# Patient Record
Sex: Female | Born: 1940
Health system: Southern US, Community
[De-identification: ages and names within clinical notes are randomized; demographics above are authoritative.]

## PROBLEM LIST (undated history)

## (undated) DIAGNOSIS — H269 Unspecified cataract: Secondary | ICD-10-CM

## (undated) DIAGNOSIS — S0590XA Unspecified injury of unspecified eye and orbit, initial encounter: Secondary | ICD-10-CM

## (undated) DIAGNOSIS — I10 Essential (primary) hypertension: Secondary | ICD-10-CM

## (undated) HISTORY — DX: Unspecified injury of unspecified eye and orbit, initial encounter: S05.90XA

## (undated) HISTORY — PX: OTHER SURGICAL HISTORY: SHX169

## (undated) HISTORY — PX: CHOLECYSTECTOMY: SHX55

## (undated) HISTORY — DX: Unspecified cataract: H26.9

## (undated) HISTORY — DX: Essential (primary) hypertension: I10

---

## 2006-05-04 ENCOUNTER — Ambulatory Visit (HOSPITAL_BASED_OUTPATIENT_CLINIC_OR_DEPARTMENT_OTHER): Admission: RE | Admit: 2006-05-04 | Discharge: 2006-05-04 | Payer: Self-pay | Admitting: Orthopedic Surgery

## 2007-05-04 ENCOUNTER — Encounter: Payer: Self-pay | Admitting: Cardiology

## 2008-03-13 ENCOUNTER — Encounter: Payer: Self-pay | Admitting: Cardiology

## 2008-09-04 ENCOUNTER — Encounter: Payer: Self-pay | Admitting: Cardiology

## 2008-10-15 ENCOUNTER — Encounter: Payer: Self-pay | Admitting: Cardiology

## 2009-06-17 ENCOUNTER — Encounter: Payer: Self-pay | Admitting: Cardiology

## 2010-02-17 ENCOUNTER — Encounter: Payer: Self-pay | Admitting: Cardiology

## 2010-02-24 ENCOUNTER — Ambulatory Visit: Payer: Self-pay | Admitting: Cardiology

## 2010-02-24 ENCOUNTER — Encounter: Payer: Self-pay | Admitting: Cardiology

## 2010-03-03 ENCOUNTER — Encounter (INDEPENDENT_AMBULATORY_CARE_PROVIDER_SITE_OTHER): Payer: Self-pay | Admitting: *Deleted

## 2010-03-03 ENCOUNTER — Ambulatory Visit: Payer: Self-pay | Admitting: Cardiology

## 2010-03-03 DIAGNOSIS — I10 Essential (primary) hypertension: Secondary | ICD-10-CM | POA: Insufficient documentation

## 2010-03-09 ENCOUNTER — Ambulatory Visit: Payer: Self-pay | Admitting: Cardiology

## 2010-03-09 ENCOUNTER — Encounter: Payer: Self-pay | Admitting: Cardiology

## 2010-03-11 ENCOUNTER — Telehealth (INDEPENDENT_AMBULATORY_CARE_PROVIDER_SITE_OTHER): Payer: Self-pay | Admitting: *Deleted

## 2010-03-13 ENCOUNTER — Encounter: Payer: Self-pay | Admitting: Cardiology

## 2010-03-13 ENCOUNTER — Encounter (INDEPENDENT_AMBULATORY_CARE_PROVIDER_SITE_OTHER): Payer: Self-pay | Admitting: *Deleted

## 2010-03-16 ENCOUNTER — Encounter: Payer: Self-pay | Admitting: Cardiology

## 2010-03-20 ENCOUNTER — Ambulatory Visit: Payer: Self-pay | Admitting: Cardiology

## 2010-03-20 ENCOUNTER — Inpatient Hospital Stay (HOSPITAL_BASED_OUTPATIENT_CLINIC_OR_DEPARTMENT_OTHER): Admission: RE | Admit: 2010-03-20 | Discharge: 2010-03-20 | Payer: Self-pay | Admitting: Cardiology

## 2010-03-23 ENCOUNTER — Telehealth (INDEPENDENT_AMBULATORY_CARE_PROVIDER_SITE_OTHER): Payer: Self-pay | Admitting: *Deleted

## 2010-03-26 ENCOUNTER — Ambulatory Visit: Payer: Self-pay | Admitting: Cardiology

## 2010-03-26 DIAGNOSIS — I2510 Atherosclerotic heart disease of native coronary artery without angina pectoris: Secondary | ICD-10-CM | POA: Insufficient documentation

## 2010-09-29 NOTE — Letter (Signed)
Summary: External Correspondence/ OFFICE NOTE [facility]  External Correspondence/ OFFICE NOTE [facility]   Imported By: Dorise Hiss 02/26/2010 15:06:17  _____________________________________________________________________  External Attachment:    Type:   Image     Comment:   External Document

## 2010-09-29 NOTE — Progress Notes (Signed)
Summary: Low Grade temp following cath  Phone Note Call from Patient Call back at Work Phone [phone]   Summary of Call: Pt states she has been running a low grade fever. She states yesterday her temp was 100.1 and today it was 99. She states her cath insertion site at her groin looks good with no swelling, redness, drainage or pain. She states she did have some pain when she urinated this am like she could possibly have an UTI. Pt will monitor symptoms and go to primary MD if urinary symptoms continue. She is aware to go to ER for redness, drainage, swelling or pain at cath insertion site. Pt verbalized understanding.  Initial call taken by: Cyril Loosen, RN, BSN,  March 23, 2010 10:49 AM

## 2010-09-29 NOTE — Progress Notes (Signed)
Summary: RESULTS OF STRESS TEST  Phone Note Call from Patient Call back at Home Phone [phone]   Reason for Call: Lab or Test Results Summary of Call: RESULTS OF STRESS TEST ON 03/09/2010 Initial call taken by: Dorise Hiss,  March 11, 2010 10:01 AM  Follow-up for Phone Call        Left message to notify pt that test has not been reviewed by Dr. Antoine Poche and she will be notified as soon as these results are reviewed. Follow-up by: Cyril Loosen, RN, BSN,  March 11, 2010 4:04 PM

## 2010-09-29 NOTE — Letter (Signed)
Summary: Cardiac Cath Instructions - [facility]  [facility] at [facility] [address]. [address]   [place], [place] [address]   Phone: [phone]  Fax: [phone]     03/13/2010 MRN: [id number]  Anna Flynn [address] [place], [place]  [address]  Dear Ms. Storer,   You are scheduled for a Cardiac Catheterization on Friday, March 20, 2010 with Dr. Antoine Poche.  Please arrive to the 1st floor of the Heart and Vascular Center at [facility] at 8:30 am  on the day of your procedure. Please do not arrive before 6:30 a.m. Call the Heart and Vascular Center at [phone] if you are unable to make your appointmnet. The Code to get into the parking garage under the building is 1000. Take the elevators to the 1st floor. You must have someone to drive you home. Someone must be with you for the first 24 hours after you arrive home. Please wear clothes that are easy to get on and off and wear slip-on shoes. Do not eat or drink after midnight except water with your medications that morning. Bring all your medications and current insurance cards with you.  ___ DO NOT take these medications before your procedure:  _X__ Make sure you take your aspirin.  _X__ You may take ALL of your medications with water that morning.  ___ DO NOT take ANY medications before your procedure.  ___ Pre-med instructions:  ________________________________________________________________________  The usual length of stay after your procedure is 2 to 3 hours. This can vary.  If you have any questions, please call the office at the number listed above.  Cyril Loosen, RN, BSN              Directions to the [facility]  Please Note : Park in [place] under the building not the parking deck.  From [employer]: Turn onto [address] Left onto [address] (1st stoplight) Right at the brick entrance to the hospital ([address]) Bear to the right  and you will see a blue sign "Heart and Vascular Center" Parking garage is a sharp right'to get through the gate out in the code _1000______. Once you park, take the elevator to the first floor. Please do not arrive before 0630am. The building will be dark before that time.   From [address] Turn onto [address] Turn left into the brick entrance to the hospital (Main circle [address]) [place] to the right and you will see a blue sign "[facility] V[facility] Center" Parking garage is a sharp right, to get thru the gate put in the code 1000____. Once you park, take the elevator to the first floor. Please do not arrive before 0630am. The building will be dark before that time

## 2010-09-29 NOTE — Letter (Signed)
Summary: External Correspondence/ FAXED  PRE-CATH ORDER  External Correspondence/ FAXED  PRE-CATH ORDER   Imported By: Dorise Hiss 03/24/2010 10:18:02  _____________________________________________________________________  External Attachment:    Type:   Image     Comment:   External Document

## 2010-09-29 NOTE — Assessment & Plan Note (Signed)
Summary: ckafterprocedure/hochreinpat/rm   Visit Type:  Follow-up Primary Provider:  Quintin Alto  CC:  check right cath site.  History of Present Illness: patient presents for post catheterization followup.  She was recently referred here to Dr. Antoine Poche, following an abnormal perfusion imaging study suggestive of inferior ischemia. This was in the setting of complaint of dizziness. The patient informs me today that she never experienced any chest pain.  Dr. Antoine Poche initially recommended a followup dobutamine echocardiogram, given his concern that this was probably a false positive test result. Dr. Andee Lineman reviewed the stress echo, and indicated suggestion of possible mild inferior ischemia. Given this, Dr. Antoine Poche recommended proceeding with a diagnostic coronary angiogram, for a definitive answer.  Dr. Antoine Poche performed the study himself, which yielded minimal coronary artery disease (25% ostial LAD), and normal LV function (EF 55%) with normal wall motion. Continued risk factor reduction was recommended.  Clinically, patient denies any chest pain, or complications of the right groin incision site.     Preventive Screening-Counseling & Management  Alcohol-Tobacco     Smoking Status: never  Current Medications (verified): 1)  Advil 200 Mg Tabs (Ibuprofen) .... As Needed  Allergies (verified): 1)  ! Codeine 2)  ! Relafen  Comments:  Nurse/Medical Assistant: The patient's medications and allergies were verbally reviewed with the patient and were updated in the Medication and Allergy Lists.  Past History:  Past Medical History: hypertension  Review of Systems       No fevers, chills, hemoptysis, dysphagia, melena, hematocheezia, hematuria, rash, claudication, orthopnea, pnd, pedal edema. All other systems negative.   Vital Signs:  Patient profile:   70 year old female Height:      65 inches Weight:      185 pounds Pulse rate:   101 / minute BP sitting:   162  / 114  (left arm) Cuff size:   large  Vitals Entered By: Carlye Grippe (March 26, 2010 1:03 PM) CC: check right cath site   Physical Exam  Additional Exam:  GEN :70 year old female, mildly obese, in no distress HEENT: NCAT,PERRLA,EOMI NECK: palpable pulses, no bruits; no JVD; no TM LUNGS: CTA bilaterally HEART: RRR (S1S2); no significant murmurs; no rubs; no gallops ABD: soft, NT; intact BS [id number] groin stable with palpable pulse, no ecchymosis, and no bruit; no peripheral edema SKIN: warm, dry MUSC: no obvious deformity NEURO: A/O (x3)  \par  Impression & Recommendations:  Problem # 1:  CAD (ICD-414.00)  no further workup indicated. Patient presents post catheterization, which yielded minimal CAD and normal LV function, following a false positive perfusion imaging study. Recommendation is for risk factor modification, starting with low dose aspirin for prophylaxis. Patient is concerned about the combined effects with nonsteroidals, which she uses on a near daily basis for arthritis. I instructed her to keep a close eye out for possible bruising or evidence of bleeding. I also instructed her to monitor her cholesterol levels, trying to maintain an LDL of 70 or less, if feasible. She is to followup with Dr. Leandrew Koyanagi, and will otherwise return to Dr. Antoine Poche on an as-needed basis.  Problem # 2:  ESSENTIAL HYPERTENSION, BENIGN (ICD-401.1)  patient suggests that she may have "white coat" hypertension, given that this does appear to be elevated whenever she is in an MD's office. She reportedly has systolic readings less than 140, at home. I instructed her to monitor this closely, and to followup with Dr. Leandrew Koyanagi.  Patient Instructions: 1)  Follow up as needed  2)  Your physician discussed the risks, benefits and indications for preventive aspirin therapy. It is recommended that you start (or continue) taking 81 mg of aspirin a day.

## 2010-09-29 NOTE — Assessment & Plan Note (Signed)
Summary: NP-ABNORMAL ADENOSINE STUDY   Visit Type:  Initial Consult Primary Provider:  Quintin Alto  CC:  abnormal EKG and stress test.  History of Present Illness: The H. and presents for evaluation of an abnormal stress test. She had episodes of dizziness and some chest discomfort and so was sent for a stress test. She said she was not able to walk on a treadmill because of dizziness and so this was a pharmacologic perfusion study. This was read as abnormal with a well-preserved ejection fraction of 58%. However, there was a reversible defect medium sized in the basal inferior wall. The patient is referred for evaluation of this.  She is active taking care of 2 young great-grandchildren. She does walking for exercise as well. With this she denies any chest pressure, neck or arm discomfort. She does not shortness of breath, PND or orthopnea. She does not report palpitations, presyncope or syncope. She does have occasional dizziness which she treats with a Dramamine. She has not had any frank loss of consciousness. She does have rare chest discomfort over the years with the last episode being 2 days ago. This is always while driving. It is in the upper epigastric discomfort that does not radiate and is associated with some diaphoresis. However, it always goes away with burping. She cannot bring this on with activity. She has had it in frequently for several years.  Preventive Screening-Counseling & Management  Alcohol-Tobacco     Smoking Status: never  Current Medications (verified): 1)  Advil 200 Mg Tabs (Ibuprofen) .... As Needed  Allergies (verified): 1)  ! Codeine 2)  ! Relafen  Comments:  Nurse/Medical Assistant: The patient's medications and allergies were verbally reviewed with the patient and were updated in the Medication and Allergy Lists.  Past History:  Past Medical History: None  Past Surgical History: Cholecystectomy Left thumb A1 pulley release.  Family  History: The patient has no history of early heart disease other than a brother who apparently died of a heart attack related to electroshock therapy for depression. He was in his early 55s.  Social History: Smoking Status:  never  Review of Systems       As stated in the HPI and negative for all other systems.   Vital Signs:  Patient profile:   70 year old female Height:      65 inches Weight:      185 pounds BMI:     30.90 Pulse rate:   87 / minute Pulse (ortho):   90 / minute BP sitting:   157 / 90  (left arm) BP standing:   147 / 82 Cuff size:   large  Vitals Entered By: Carlye Grippe (March 03, 2010 11:06 AM)  Nutrition Counseling: Patient's BMI is greater than 25 and therefore counseled on weight management options.  Serial Vital Signs/Assessments:  Time      Position  BP       Pulse  Resp  Temp     By 11:22 AM  Lying LA  128/79   86                    Lydia Anderson 11:22 AM  Sitting   135/75   93                    Lydia Anderson 11:22 AM  Standing  147/82   90  Carlye Grippe 11:24 AM  Standing  142/89   92                    Lydia Anderson 11:29 AM  Standing  151/89   97                    Carlye Grippe  CC: abnormal EKG,stress test   Physical Exam  General:  Well developed, well nourished, in no acute distress. Head:  normocephalic and atraumatic Eyes:  PERRLA/EOM intact; conjunctiva and lids normal. Mouth:  Teeth, gums and palate normal. Oral mucosa normal. Neck:  Neck supple, no JVD. No masses, thyromegaly or abnormal cervical nodes. Chest Wall:  no deformities or breast masses noted Lungs:  Clear bilaterally to auscultation and percussion. Abdomen:  Bowel sounds positive; abdomen soft and non-tender without masses, organomegaly, or hernias noted. No hepatosplenomegaly. Msk:  Back normal, normal gait. Muscle strength and tone normal. Extremities:  No clubbing or cyanosis. Neurologic:  Alert and oriented x 3. Skin:  Intact without  lesions or rashes. Cervical Nodes:  no significant adenopathy Axillary Nodes:  no significant adenopathy Inguinal Nodes:  no significant adenopathy Psych:  Normal affect.   Detailed Cardiovascular Exam  Neck    Carotids: Carotids full and equal bilaterally without bruits.      Neck Veins: Normal, no JVD.    Heart    Inspection: no deformities or lifts noted.      Palpation: normal PMI with no thrills palpable.      Auscultation: regular rate and rhythm, S1, S2 without murmurs, rubs, gallops, or clicks.    Vascular    Abdominal Aorta: no palpable masses, pulsations, or audible bruits.      Femoral Pulses: normal femoral pulses bilaterally.      Pedal Pulses: normal pedal pulses bilaterally.      Radial Pulses: normal radial pulses bilaterally.      Peripheral Circulation: no clubbing, cyanosis, or edema noted with normal capillary refill.     Impression & Recommendations:  Problem # 1:  OTH NONSPECIFIC ABNORM CV SYSTEM FUNCTION STUDY (ICD-794.39) The patient had an abnormal stress perfusion study. However, in the absence of symptoms, significant risk factors, abnormal physical findings or diagnostic EKG I think that the possibility of a false positive test is very high. I discussed this at length with the patient and her husband. I do not think invasive evaluation is warranted at this point but would rather pursue a dobutamine echocardiogram. Because of previous dizziness she doesn't think she would be able to walk on a treadmill. If this is negative for wall motion abnormalities no further workup would be suggested. Orders: EKG w/ Interpretation (93000) Echo- Dobutamine (Dobutamine Echo)  Problem # 2:  SYNCOPE AND COLLAPSE (ICD-780.2) She had negative orthostatic blood pressures. I would not suggest a cardiac etiology. No further workup is suggested. Orders: EKG w/ Interpretation (93000) Echo- Dobutamine (Dobutamine Echo)  Problem # 3:  ESSENTIAL HYPERTENSION, BENIGN  (ICD-401.1) Her blood pressure is slightly elevated today. However, she reports that this is unusual. She can keep a blood pressure diary with further treatments based on these readings.  Patient Instructions: 1)  Your physician has requested that you have a dobutamine echocardiogram.  For further information please visit [email or web address]  Please follow instruction sheet as given. 2)  Your physician recommends that you continue on your current medications as directed. Please refer to the Current Medication list given to you today.

## 2010-09-29 NOTE — Letter (Signed)
Summary: Dobutamine Echocardiogram  [facility] at [facility] [address]. [address]   [place], [place] [address]   Phone: [phone]  Fax: [phone]      [facility]  Dobutamine Echocardiogram    Lateria Howington  Appointment Date:_  Appointment Time:_   Your doctor has ordered a Dobutamine echo to help determine the condition of our heart. If you take blood pressure medication, ask your doctor if you should take your medications the day of the procedure. Do not eat and drink 4 hours before the test is scheduled.  You will be asked to undress from the waist up and given a hospital gown to wear, so dress comfortaly from the waist down.  You will need to register at the Outpatient Department at [facility], located at the front enttrance of the hospital. Make sure to bring your insurance information and a form of ID.  Your appointment will last approximately one and one-half hours

## 2011-01-15 NOTE — Op Note (Signed)
NAME:  Anna Flynn, Anna Flynn               ACCOUNT NO.:  [id number]   MEDICAL RECORD NO.:  [id number]          PATIENT TYPE:  AMB   LOCATION:  DSC                          FACILITY:  [facility]   PHYSICIAN:  Artist Pais. Weingold, M.D.DATE OF BIRTH:  10-09-1940   DATE OF PROCEDURE:  DATE OF DISCHARGE:  05/04/2006                                 OPERATIVE REPORT   REDICTATION:   DATE OF SERVICE:  05/04/2006   PREOPERATIVE DIAGNOSIS:  Left thumb chronic stenosing tenosynovitis.   POSTOPERATIVE DIAGNOSIS:  Left thumb chronic stenosing tenosynovitis.   PROCEDURE:  Left thumb __________ .   SURGEON:  Artist Pais. Mina Marble, M.D.   ASSISTANT:  None.   ANESTHESIA:  Monitored anesthesia care.  __________ anesthesia of 2% plain  lidocaine, 0.25% Marcaine, 3 mL performed by the surgeon.   TOURNIQUET TIME:  10 minutes.   COMPLICATIONS:  None.   DRAINS:  None.   OPERATIVE REPORT:  The patient was taken to the operating room with the  induction of adequate IV sedation. The left upper extremity is prepped and  draped in sterile fashion.  An Esmarch was used to exsanguinate the limb.  The tourniquet was then inflated to 250 mmHg.  At this point in time 3 mL  combination of 0.25% Marcaine, 2% lidocaine was injected into A1 pulley area  of the left thumb.  The skin was incised, neurovascular bundle was  identified and retracted.  The A1 pulley was scored with a #15 blade.  The  A1 pulley was lysed of all adhesions.  It was irrigated and easily closed  with 4-0 nylon x3 sutures. Xeroform, 4x4s and compressive wraps.  The  patient tolerated the procedure well __________      Artist Pais. Mina Marble, M.D.  Electronically Signed     MAW/MEDQ  D:  05/25/2006  T:  05/25/2006  Job:  [id number]

## 2011-01-15 NOTE — Op Note (Signed)
NAME:  Anna Flynn, Anna Flynn               ACCOUNT NO.:  [id number]   MEDICAL RECORD NO.:  [id number]          PATIENT TYPE:  AMB   LOCATION:  DSC                          FACILITY:  [facility]   PHYSICIAN:  Artist Pais. Weingold, M.D.DATE OF BIRTH:  17-Mar-1941   DATE OF PROCEDURE:  05/04/2006  DATE OF DISCHARGE:                                 OPERATIVE REPORT   PREOPERATIVE DIAGNOSIS:  Chronic left thumb stenosing tenosynovitis.   POSTOPERATIVE DIAGNOSIS:  Chronic left thumb stenosing tenosynovitis.   PROCEDURE:  Left thumb A1 pulley release.   SURGEON:  Artist Pais. Mina Marble, MD   ASSISTANT:  None.   ANESTHESIA:  IV sedation with a combination of 2% plain lidocaine, 0.25%  Marcaine 3 mL field block by the surgeon.   TOURNIQUET TIME:  10 minutes.   COMPLICATIONS:  None.   DRAINS:  None.   OPERATIVE REPORT:  The patient was taken to the operating room.  After  induction of adequate IV sedation, left upper extremity was prepped in the  usual sterile fashion.  Esmarch was used to exsanguinate the limb.  Tourniquet was then inflated to 250 mmHg.  At this point in time, 3 mL  combination of 0.25% plain Marcaine and 2% plain lidocaine was injected in  the A1 pulley area of left thumb.  Incision was made transversely in the MP  flexion crease.  Skin was incised.  Neurovascular bundles identified and  retracted.  A1 pulley was split with a 15-blade.  FPL tendon was lysed of  all adhesions.  Wound was irrigated and easily closed with 5-0 nylon.  Steri-  Strips, 4 x 4 Fluffs, and compressive dressing was applied.  The patient  tolerated the procedure well.  Went to recovery room in stable fashion.      Artist Pais Mina Marble, M.D.  Electronically Signed     MAW/MEDQ  D:  05/04/2006  T:  05/04/2006  Job:  [id number]

## 2011-09-10 DIAGNOSIS — E559 Vitamin D deficiency, unspecified: Secondary | ICD-10-CM | POA: Diagnosis not present

## 2011-09-10 DIAGNOSIS — R5383 Other fatigue: Secondary | ICD-10-CM | POA: Diagnosis not present

## 2011-09-10 DIAGNOSIS — E785 Hyperlipidemia, unspecified: Secondary | ICD-10-CM | POA: Diagnosis not present

## 2011-09-10 DIAGNOSIS — E538 Deficiency of other specified B group vitamins: Secondary | ICD-10-CM | POA: Diagnosis not present

## 2011-09-17 DIAGNOSIS — C4491 Basal cell carcinoma of skin, unspecified: Secondary | ICD-10-CM | POA: Diagnosis not present

## 2011-09-17 DIAGNOSIS — E559 Vitamin D deficiency, unspecified: Secondary | ICD-10-CM | POA: Diagnosis not present

## 2011-09-17 DIAGNOSIS — J309 Allergic rhinitis, unspecified: Secondary | ICD-10-CM | POA: Diagnosis not present

## 2011-09-17 DIAGNOSIS — R51 Headache: Secondary | ICD-10-CM | POA: Diagnosis not present

## 2011-09-17 DIAGNOSIS — E538 Deficiency of other specified B group vitamins: Secondary | ICD-10-CM | POA: Diagnosis not present

## 2011-09-17 DIAGNOSIS — R5383 Other fatigue: Secondary | ICD-10-CM | POA: Diagnosis not present

## 2011-11-08 DIAGNOSIS — J019 Acute sinusitis, unspecified: Secondary | ICD-10-CM | POA: Diagnosis not present

## 2011-11-09 DIAGNOSIS — D2330 Other benign neoplasm of skin of unspecified part of face: Secondary | ICD-10-CM | POA: Diagnosis not present

## 2011-11-09 DIAGNOSIS — L570 Actinic keratosis: Secondary | ICD-10-CM | POA: Diagnosis not present

## 2011-11-09 DIAGNOSIS — D485 Neoplasm of uncertain behavior of skin: Secondary | ICD-10-CM | POA: Diagnosis not present

## 2011-12-21 DIAGNOSIS — J309 Allergic rhinitis, unspecified: Secondary | ICD-10-CM | POA: Diagnosis not present

## 2011-12-21 DIAGNOSIS — L259 Unspecified contact dermatitis, unspecified cause: Secondary | ICD-10-CM | POA: Diagnosis not present

## 2011-12-23 DIAGNOSIS — Z803 Family history of malignant neoplasm of breast: Secondary | ICD-10-CM | POA: Diagnosis not present

## 2011-12-23 DIAGNOSIS — Z1231 Encounter for screening mammogram for malignant neoplasm of breast: Secondary | ICD-10-CM | POA: Diagnosis not present

## 2012-02-23 ENCOUNTER — Encounter: Payer: Self-pay | Admitting: Cardiology

## 2012-03-10 DIAGNOSIS — E559 Vitamin D deficiency, unspecified: Secondary | ICD-10-CM | POA: Diagnosis not present

## 2012-03-10 DIAGNOSIS — R51 Headache: Secondary | ICD-10-CM | POA: Diagnosis not present

## 2012-03-10 DIAGNOSIS — R5383 Other fatigue: Secondary | ICD-10-CM | POA: Diagnosis not present

## 2012-03-10 DIAGNOSIS — E7800 Pure hypercholesterolemia, unspecified: Secondary | ICD-10-CM | POA: Diagnosis not present

## 2012-03-10 DIAGNOSIS — E538 Deficiency of other specified B group vitamins: Secondary | ICD-10-CM | POA: Diagnosis not present

## 2012-03-17 DIAGNOSIS — R5381 Other malaise: Secondary | ICD-10-CM | POA: Diagnosis not present

## 2012-03-17 DIAGNOSIS — R51 Headache: Secondary | ICD-10-CM | POA: Diagnosis not present

## 2012-03-17 DIAGNOSIS — R5383 Other fatigue: Secondary | ICD-10-CM | POA: Diagnosis not present

## 2012-03-17 DIAGNOSIS — J309 Allergic rhinitis, unspecified: Secondary | ICD-10-CM | POA: Diagnosis not present

## 2012-03-17 DIAGNOSIS — G2581 Restless legs syndrome: Secondary | ICD-10-CM | POA: Diagnosis not present

## 2012-03-17 DIAGNOSIS — E538 Deficiency of other specified B group vitamins: Secondary | ICD-10-CM | POA: Diagnosis not present

## 2012-03-17 DIAGNOSIS — C4491 Basal cell carcinoma of skin, unspecified: Secondary | ICD-10-CM | POA: Diagnosis not present

## 2012-03-17 DIAGNOSIS — E559 Vitamin D deficiency, unspecified: Secondary | ICD-10-CM | POA: Diagnosis not present

## 2012-06-06 DIAGNOSIS — J019 Acute sinusitis, unspecified: Secondary | ICD-10-CM | POA: Diagnosis not present

## 2012-06-06 DIAGNOSIS — J309 Allergic rhinitis, unspecified: Secondary | ICD-10-CM | POA: Diagnosis not present

## 2012-06-06 DIAGNOSIS — J029 Acute pharyngitis, unspecified: Secondary | ICD-10-CM | POA: Diagnosis not present

## 2012-07-17 DIAGNOSIS — J019 Acute sinusitis, unspecified: Secondary | ICD-10-CM | POA: Diagnosis not present

## 2012-10-30 DIAGNOSIS — M2010 Hallux valgus (acquired), unspecified foot: Secondary | ICD-10-CM | POA: Diagnosis not present

## 2012-10-30 DIAGNOSIS — M2040 Other hammer toe(s) (acquired), unspecified foot: Secondary | ICD-10-CM | POA: Diagnosis not present

## 2012-10-30 DIAGNOSIS — M2140 Flat foot [pes planus] (acquired), unspecified foot: Secondary | ICD-10-CM | POA: Diagnosis not present

## 2012-12-05 DIAGNOSIS — M25579 Pain in unspecified ankle and joints of unspecified foot: Secondary | ICD-10-CM | POA: Diagnosis not present

## 2012-12-05 DIAGNOSIS — M79609 Pain in unspecified limb: Secondary | ICD-10-CM | POA: Diagnosis not present

## 2012-12-12 DIAGNOSIS — C44319 Basal cell carcinoma of skin of other parts of face: Secondary | ICD-10-CM | POA: Diagnosis not present

## 2012-12-12 DIAGNOSIS — D485 Neoplasm of uncertain behavior of skin: Secondary | ICD-10-CM | POA: Diagnosis not present

## 2012-12-12 DIAGNOSIS — L408 Other psoriasis: Secondary | ICD-10-CM | POA: Diagnosis not present

## 2012-12-12 DIAGNOSIS — L851 Acquired keratosis [keratoderma] palmaris et plantaris: Secondary | ICD-10-CM | POA: Diagnosis not present

## 2012-12-18 DIAGNOSIS — N814 Uterovaginal prolapse, unspecified: Secondary | ICD-10-CM | POA: Diagnosis not present

## 2012-12-18 DIAGNOSIS — Z01419 Encounter for gynecological examination (general) (routine) without abnormal findings: Secondary | ICD-10-CM | POA: Diagnosis not present

## 2012-12-18 DIAGNOSIS — I10 Essential (primary) hypertension: Secondary | ICD-10-CM | POA: Diagnosis not present

## 2012-12-18 DIAGNOSIS — N8111 Cystocele, midline: Secondary | ICD-10-CM | POA: Diagnosis not present

## 2012-12-18 DIAGNOSIS — N8112 Cystocele, lateral: Secondary | ICD-10-CM | POA: Diagnosis not present

## 2012-12-18 DIAGNOSIS — M1710 Unilateral primary osteoarthritis, unspecified knee: Secondary | ICD-10-CM | POA: Diagnosis not present

## 2012-12-27 DIAGNOSIS — M79609 Pain in unspecified limb: Secondary | ICD-10-CM | POA: Diagnosis not present

## 2012-12-27 DIAGNOSIS — M25579 Pain in unspecified ankle and joints of unspecified foot: Secondary | ICD-10-CM | POA: Diagnosis not present

## 2013-01-11 DIAGNOSIS — Z803 Family history of malignant neoplasm of breast: Secondary | ICD-10-CM | POA: Diagnosis not present

## 2013-01-11 DIAGNOSIS — Z1231 Encounter for screening mammogram for malignant neoplasm of breast: Secondary | ICD-10-CM | POA: Diagnosis not present

## 2013-01-11 DIAGNOSIS — C44319 Basal cell carcinoma of skin of other parts of face: Secondary | ICD-10-CM | POA: Diagnosis not present

## 2013-06-15 DIAGNOSIS — Z23 Encounter for immunization: Secondary | ICD-10-CM | POA: Diagnosis not present

## 2013-06-15 DIAGNOSIS — M79609 Pain in unspecified limb: Secondary | ICD-10-CM | POA: Diagnosis not present

## 2013-06-26 DIAGNOSIS — M79609 Pain in unspecified limb: Secondary | ICD-10-CM | POA: Diagnosis not present

## 2013-06-26 DIAGNOSIS — L600 Ingrowing nail: Secondary | ICD-10-CM | POA: Diagnosis not present

## 2013-09-04 DIAGNOSIS — B029 Zoster without complications: Secondary | ICD-10-CM | POA: Diagnosis not present

## 2013-09-06 DIAGNOSIS — R5383 Other fatigue: Secondary | ICD-10-CM | POA: Diagnosis not present

## 2013-09-06 DIAGNOSIS — R5381 Other malaise: Secondary | ICD-10-CM | POA: Diagnosis not present

## 2013-09-06 DIAGNOSIS — G2581 Restless legs syndrome: Secondary | ICD-10-CM | POA: Diagnosis not present

## 2013-09-06 DIAGNOSIS — E785 Hyperlipidemia, unspecified: Secondary | ICD-10-CM | POA: Diagnosis not present

## 2013-09-06 DIAGNOSIS — E559 Vitamin D deficiency, unspecified: Secondary | ICD-10-CM | POA: Diagnosis not present

## 2013-09-06 DIAGNOSIS — E538 Deficiency of other specified B group vitamins: Secondary | ICD-10-CM | POA: Diagnosis not present

## 2013-09-12 DIAGNOSIS — C4491 Basal cell carcinoma of skin, unspecified: Secondary | ICD-10-CM | POA: Diagnosis not present

## 2013-09-12 DIAGNOSIS — E559 Vitamin D deficiency, unspecified: Secondary | ICD-10-CM | POA: Diagnosis not present

## 2013-09-12 DIAGNOSIS — G2581 Restless legs syndrome: Secondary | ICD-10-CM | POA: Diagnosis not present

## 2013-09-12 DIAGNOSIS — J309 Allergic rhinitis, unspecified: Secondary | ICD-10-CM | POA: Diagnosis not present

## 2013-09-12 DIAGNOSIS — B029 Zoster without complications: Secondary | ICD-10-CM | POA: Diagnosis not present

## 2013-09-29 DIAGNOSIS — J019 Acute sinusitis, unspecified: Secondary | ICD-10-CM | POA: Diagnosis not present

## 2013-09-29 DIAGNOSIS — J45901 Unspecified asthma with (acute) exacerbation: Secondary | ICD-10-CM | POA: Diagnosis not present

## 2014-03-22 DIAGNOSIS — Z1231 Encounter for screening mammogram for malignant neoplasm of breast: Secondary | ICD-10-CM | POA: Diagnosis not present

## 2014-03-22 DIAGNOSIS — Z803 Family history of malignant neoplasm of breast: Secondary | ICD-10-CM | POA: Diagnosis not present

## 2014-05-16 DIAGNOSIS — N949 Unspecified condition associated with female genital organs and menstrual cycle: Secondary | ICD-10-CM | POA: Diagnosis not present

## 2015-01-28 DIAGNOSIS — D519 Vitamin B12 deficiency anemia, unspecified: Secondary | ICD-10-CM | POA: Diagnosis not present

## 2015-01-28 DIAGNOSIS — R5382 Chronic fatigue, unspecified: Secondary | ICD-10-CM | POA: Diagnosis not present

## 2015-01-28 DIAGNOSIS — E782 Mixed hyperlipidemia: Secondary | ICD-10-CM | POA: Diagnosis not present

## 2015-01-28 DIAGNOSIS — E559 Vitamin D deficiency, unspecified: Secondary | ICD-10-CM | POA: Diagnosis not present

## 2015-02-03 DIAGNOSIS — M2012 Hallux valgus (acquired), left foot: Secondary | ICD-10-CM | POA: Diagnosis not present

## 2015-02-03 DIAGNOSIS — E559 Vitamin D deficiency, unspecified: Secondary | ICD-10-CM | POA: Diagnosis not present

## 2015-02-03 DIAGNOSIS — Z23 Encounter for immunization: Secondary | ICD-10-CM | POA: Diagnosis not present

## 2015-02-03 DIAGNOSIS — M79671 Pain in right foot: Secondary | ICD-10-CM | POA: Diagnosis not present

## 2015-02-03 DIAGNOSIS — M2041 Other hammer toe(s) (acquired), right foot: Secondary | ICD-10-CM | POA: Diagnosis not present

## 2015-02-03 DIAGNOSIS — Z1389 Encounter for screening for other disorder: Secondary | ICD-10-CM | POA: Diagnosis not present

## 2015-02-03 DIAGNOSIS — D519 Vitamin B12 deficiency anemia, unspecified: Secondary | ICD-10-CM | POA: Diagnosis not present

## 2015-02-03 DIAGNOSIS — M2011 Hallux valgus (acquired), right foot: Secondary | ICD-10-CM | POA: Diagnosis not present

## 2015-02-10 DIAGNOSIS — Z85828 Personal history of other malignant neoplasm of skin: Secondary | ICD-10-CM | POA: Diagnosis not present

## 2015-02-10 DIAGNOSIS — D239 Other benign neoplasm of skin, unspecified: Secondary | ICD-10-CM | POA: Diagnosis not present

## 2015-03-26 DIAGNOSIS — Z1231 Encounter for screening mammogram for malignant neoplasm of breast: Secondary | ICD-10-CM | POA: Diagnosis not present

## 2015-03-26 DIAGNOSIS — Z803 Family history of malignant neoplasm of breast: Secondary | ICD-10-CM | POA: Diagnosis not present

## 2015-05-02 DIAGNOSIS — M7062 Trochanteric bursitis, left hip: Secondary | ICD-10-CM | POA: Diagnosis not present

## 2015-05-19 DIAGNOSIS — L247 Irritant contact dermatitis due to plants, except food: Secondary | ICD-10-CM | POA: Diagnosis not present

## 2015-05-28 DIAGNOSIS — R21 Rash and other nonspecific skin eruption: Secondary | ICD-10-CM | POA: Diagnosis not present

## 2015-07-21 DIAGNOSIS — J209 Acute bronchitis, unspecified: Secondary | ICD-10-CM | POA: Diagnosis not present

## 2015-07-21 DIAGNOSIS — J019 Acute sinusitis, unspecified: Secondary | ICD-10-CM | POA: Diagnosis not present

## 2015-07-28 DIAGNOSIS — R5382 Chronic fatigue, unspecified: Secondary | ICD-10-CM | POA: Diagnosis not present

## 2015-07-28 DIAGNOSIS — E782 Mixed hyperlipidemia: Secondary | ICD-10-CM | POA: Diagnosis not present

## 2015-07-28 DIAGNOSIS — E559 Vitamin D deficiency, unspecified: Secondary | ICD-10-CM | POA: Diagnosis not present

## 2015-08-04 DIAGNOSIS — G2581 Restless legs syndrome: Secondary | ICD-10-CM | POA: Diagnosis not present

## 2015-08-04 DIAGNOSIS — E559 Vitamin D deficiency, unspecified: Secondary | ICD-10-CM | POA: Diagnosis not present

## 2015-08-04 DIAGNOSIS — C44311 Basal cell carcinoma of skin of nose: Secondary | ICD-10-CM | POA: Diagnosis not present

## 2015-08-04 DIAGNOSIS — D519 Vitamin B12 deficiency anemia, unspecified: Secondary | ICD-10-CM | POA: Diagnosis not present

## 2015-08-20 DIAGNOSIS — M7061 Trochanteric bursitis, right hip: Secondary | ICD-10-CM | POA: Diagnosis not present

## 2015-10-15 DIAGNOSIS — M7061 Trochanteric bursitis, right hip: Secondary | ICD-10-CM | POA: Diagnosis not present

## 2015-10-15 DIAGNOSIS — M7631 Iliotibial band syndrome, right leg: Secondary | ICD-10-CM | POA: Diagnosis not present

## 2015-10-15 DIAGNOSIS — M2140 Flat foot [pes planus] (acquired), unspecified foot: Secondary | ICD-10-CM | POA: Diagnosis not present

## 2015-10-22 DIAGNOSIS — M25551 Pain in right hip: Secondary | ICD-10-CM | POA: Diagnosis not present

## 2015-10-22 DIAGNOSIS — M6281 Muscle weakness (generalized): Secondary | ICD-10-CM | POA: Diagnosis not present

## 2015-10-22 DIAGNOSIS — M7631 Iliotibial band syndrome, right leg: Secondary | ICD-10-CM | POA: Diagnosis not present

## 2015-10-24 DIAGNOSIS — M6281 Muscle weakness (generalized): Secondary | ICD-10-CM | POA: Diagnosis not present

## 2015-10-24 DIAGNOSIS — M7631 Iliotibial band syndrome, right leg: Secondary | ICD-10-CM | POA: Diagnosis not present

## 2015-10-24 DIAGNOSIS — M25551 Pain in right hip: Secondary | ICD-10-CM | POA: Diagnosis not present

## 2015-10-27 DIAGNOSIS — M25551 Pain in right hip: Secondary | ICD-10-CM | POA: Diagnosis not present

## 2015-10-27 DIAGNOSIS — M6281 Muscle weakness (generalized): Secondary | ICD-10-CM | POA: Diagnosis not present

## 2015-10-27 DIAGNOSIS — M7631 Iliotibial band syndrome, right leg: Secondary | ICD-10-CM | POA: Diagnosis not present

## 2015-10-31 DIAGNOSIS — M7631 Iliotibial band syndrome, right leg: Secondary | ICD-10-CM | POA: Diagnosis not present

## 2015-10-31 DIAGNOSIS — M25551 Pain in right hip: Secondary | ICD-10-CM | POA: Diagnosis not present

## 2015-10-31 DIAGNOSIS — M6281 Muscle weakness (generalized): Secondary | ICD-10-CM | POA: Diagnosis not present

## 2015-11-03 DIAGNOSIS — M25551 Pain in right hip: Secondary | ICD-10-CM | POA: Diagnosis not present

## 2015-11-03 DIAGNOSIS — M6281 Muscle weakness (generalized): Secondary | ICD-10-CM | POA: Diagnosis not present

## 2015-11-03 DIAGNOSIS — M7631 Iliotibial band syndrome, right leg: Secondary | ICD-10-CM | POA: Diagnosis not present

## 2015-11-05 DIAGNOSIS — M7631 Iliotibial band syndrome, right leg: Secondary | ICD-10-CM | POA: Diagnosis not present

## 2015-11-05 DIAGNOSIS — M6281 Muscle weakness (generalized): Secondary | ICD-10-CM | POA: Diagnosis not present

## 2015-11-05 DIAGNOSIS — M25551 Pain in right hip: Secondary | ICD-10-CM | POA: Diagnosis not present

## 2016-07-30 DIAGNOSIS — D519 Vitamin B12 deficiency anemia, unspecified: Secondary | ICD-10-CM | POA: Diagnosis not present

## 2016-07-30 DIAGNOSIS — Z8349 Family history of other endocrine, nutritional and metabolic diseases: Secondary | ICD-10-CM | POA: Diagnosis not present

## 2016-07-30 DIAGNOSIS — E559 Vitamin D deficiency, unspecified: Secondary | ICD-10-CM | POA: Diagnosis not present

## 2016-07-30 DIAGNOSIS — G2581 Restless legs syndrome: Secondary | ICD-10-CM | POA: Diagnosis not present

## 2016-07-30 DIAGNOSIS — Z1231 Encounter for screening mammogram for malignant neoplasm of breast: Secondary | ICD-10-CM | POA: Diagnosis not present

## 2016-07-30 DIAGNOSIS — E7800 Pure hypercholesterolemia, unspecified: Secondary | ICD-10-CM | POA: Diagnosis not present

## 2016-07-30 DIAGNOSIS — R5382 Chronic fatigue, unspecified: Secondary | ICD-10-CM | POA: Diagnosis not present

## 2016-08-12 DIAGNOSIS — E559 Vitamin D deficiency, unspecified: Secondary | ICD-10-CM | POA: Diagnosis not present

## 2016-08-12 DIAGNOSIS — Z6830 Body mass index (BMI) 30.0-30.9, adult: Secondary | ICD-10-CM | POA: Diagnosis not present

## 2016-08-12 DIAGNOSIS — M79671 Pain in right foot: Secondary | ICD-10-CM | POA: Diagnosis not present

## 2016-08-12 DIAGNOSIS — G2581 Restless legs syndrome: Secondary | ICD-10-CM | POA: Diagnosis not present

## 2016-08-12 DIAGNOSIS — Z0001 Encounter for general adult medical examination with abnormal findings: Secondary | ICD-10-CM | POA: Diagnosis not present

## 2016-08-12 DIAGNOSIS — D519 Vitamin B12 deficiency anemia, unspecified: Secondary | ICD-10-CM | POA: Diagnosis not present

## 2016-09-30 DIAGNOSIS — Z23 Encounter for immunization: Secondary | ICD-10-CM | POA: Diagnosis not present

## 2016-10-18 DIAGNOSIS — E860 Dehydration: Secondary | ICD-10-CM | POA: Diagnosis not present

## 2016-10-18 DIAGNOSIS — A084 Viral intestinal infection, unspecified: Secondary | ICD-10-CM | POA: Diagnosis not present

## 2017-07-27 DIAGNOSIS — H10013 Acute follicular conjunctivitis, bilateral: Secondary | ICD-10-CM | POA: Diagnosis not present

## 2017-07-27 DIAGNOSIS — H40033 Anatomical narrow angle, bilateral: Secondary | ICD-10-CM | POA: Diagnosis not present
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
## 2017-08-01 ENCOUNTER — Encounter (INDEPENDENT_AMBULATORY_CARE_PROVIDER_SITE_OTHER): Payer: Self-pay | Admitting: Ophthalmology

## 2017-08-01 ENCOUNTER — Ambulatory Visit (INDEPENDENT_AMBULATORY_CARE_PROVIDER_SITE_OTHER): Payer: Medicare Other | Admitting: Ophthalmology

## 2017-08-01 DIAGNOSIS — S058X2D Other injuries of left eye and orbit, subsequent encounter: Secondary | ICD-10-CM

## 2017-08-01 DIAGNOSIS — H268 Other specified cataract: Secondary | ICD-10-CM | POA: Diagnosis not present

## 2017-08-01 DIAGNOSIS — H3581 Retinal edema: Secondary | ICD-10-CM | POA: Diagnosis not present

## 2017-08-01 DIAGNOSIS — H0520 Unspecified exophthalmos: Secondary | ICD-10-CM | POA: Diagnosis not present

## 2017-08-01 DIAGNOSIS — H27112 Subluxation of lens, left eye: Secondary | ICD-10-CM

## 2017-08-01 DIAGNOSIS — H21562 Pupillary abnormality, left eye: Secondary | ICD-10-CM | POA: Diagnosis not present

## 2017-08-02 ENCOUNTER — Encounter (INDEPENDENT_AMBULATORY_CARE_PROVIDER_SITE_OTHER): Payer: Self-pay | Admitting: Ophthalmology

## 2017-08-03 ENCOUNTER — Encounter (INDEPENDENT_AMBULATORY_CARE_PROVIDER_SITE_OTHER): Payer: Self-pay | Admitting: Ophthalmology

## 2017-08-09 ENCOUNTER — Other Ambulatory Visit (HOSPITAL_COMMUNITY): Payer: Self-pay

## 2017-08-09 ENCOUNTER — Ambulatory Visit (HOSPITAL_COMMUNITY): Payer: Medicare Other

## 2017-08-12 DIAGNOSIS — R5382 Chronic fatigue, unspecified: Secondary | ICD-10-CM | POA: Diagnosis not present

## 2017-08-12 DIAGNOSIS — G2581 Restless legs syndrome: Secondary | ICD-10-CM | POA: Diagnosis not present

## 2017-08-12 DIAGNOSIS — D519 Vitamin B12 deficiency anemia, unspecified: Secondary | ICD-10-CM | POA: Diagnosis not present

## 2017-08-12 DIAGNOSIS — E559 Vitamin D deficiency, unspecified: Secondary | ICD-10-CM | POA: Diagnosis not present

## 2017-08-15 ENCOUNTER — Ambulatory Visit (HOSPITAL_COMMUNITY): Admission: RE | Admit: 2017-08-15 | Payer: Medicare Other | Source: Ambulatory Visit

## 2017-08-16 DIAGNOSIS — Z23 Encounter for immunization: Secondary | ICD-10-CM | POA: Diagnosis not present

## 2017-08-16 DIAGNOSIS — Z0001 Encounter for general adult medical examination with abnormal findings: Secondary | ICD-10-CM | POA: Diagnosis not present

## 2017-08-16 DIAGNOSIS — M79671 Pain in right foot: Secondary | ICD-10-CM | POA: Diagnosis not present

## 2017-08-19 ENCOUNTER — Other Ambulatory Visit (INDEPENDENT_AMBULATORY_CARE_PROVIDER_SITE_OTHER): Payer: Self-pay | Admitting: Ophthalmology

## 2017-08-19 ENCOUNTER — Ambulatory Visit (HOSPITAL_COMMUNITY)
Admission: RE | Admit: 2017-08-19 | Discharge: 2017-08-19 | Disposition: A | Discharge status: Home / Self Care | Payer: Medicare Other | Source: Ambulatory Visit | Attending: Ophthalmology | Admitting: Ophthalmology

## 2017-08-19 DIAGNOSIS — R930 Abnormal findings on diagnostic imaging of skull and head, not elsewhere classified: Secondary | ICD-10-CM | POA: Diagnosis not present

## 2017-08-19 DIAGNOSIS — H0520 Unspecified exophthalmos: Secondary | ICD-10-CM | POA: Insufficient documentation

## 2017-08-19 DIAGNOSIS — S0993XA Unspecified injury of face, initial encounter: Secondary | ICD-10-CM | POA: Diagnosis not present

## 2017-08-31 ENCOUNTER — Encounter (INDEPENDENT_AMBULATORY_CARE_PROVIDER_SITE_OTHER): Payer: Self-pay | Admitting: Ophthalmology

## 2017-10-01 DIAGNOSIS — Z1231 Encounter for screening mammogram for malignant neoplasm of breast: Secondary | ICD-10-CM | POA: Diagnosis not present

## 2017-10-01 DIAGNOSIS — Z803 Family history of malignant neoplasm of breast: Secondary | ICD-10-CM | POA: Diagnosis not present

## 2017-10-05 DIAGNOSIS — H2513 Age-related nuclear cataract, bilateral: Secondary | ICD-10-CM | POA: Diagnosis not present

## 2017-10-05 DIAGNOSIS — H401434 Capsular glaucoma with pseudoexfoliation of lens, bilateral, indeterminate stage: Secondary | ICD-10-CM | POA: Diagnosis not present

## 2017-10-10 DIAGNOSIS — H401434 Capsular glaucoma with pseudoexfoliation of lens, bilateral, indeterminate stage: Secondary | ICD-10-CM | POA: Diagnosis not present

## 2017-10-10 DIAGNOSIS — N6489 Other specified disorders of breast: Secondary | ICD-10-CM | POA: Diagnosis not present

## 2017-10-10 DIAGNOSIS — N6001 Solitary cyst of right breast: Secondary | ICD-10-CM | POA: Diagnosis not present

## 2017-10-10 DIAGNOSIS — H2513 Age-related nuclear cataract, bilateral: Secondary | ICD-10-CM | POA: Diagnosis not present

## 2017-11-09 DIAGNOSIS — H268 Other specified cataract: Secondary | ICD-10-CM | POA: Diagnosis not present

## 2017-11-09 DIAGNOSIS — H40832 Aqueous misdirection, left eye: Secondary | ICD-10-CM | POA: Diagnosis not present

## 2017-11-09 DIAGNOSIS — H2513 Age-related nuclear cataract, bilateral: Secondary | ICD-10-CM | POA: Diagnosis not present

## 2017-11-15 DIAGNOSIS — H35371 Puckering of macula, right eye: Secondary | ICD-10-CM | POA: Diagnosis not present

## 2017-11-23 DIAGNOSIS — H2513 Age-related nuclear cataract, bilateral: Secondary | ICD-10-CM | POA: Diagnosis not present

## 2017-11-23 DIAGNOSIS — H40832 Aqueous misdirection, left eye: Secondary | ICD-10-CM | POA: Diagnosis not present

## 2017-11-23 DIAGNOSIS — H268 Other specified cataract: Secondary | ICD-10-CM | POA: Diagnosis not present

## 2018-01-04 DIAGNOSIS — H2513 Age-related nuclear cataract, bilateral: Secondary | ICD-10-CM | POA: Diagnosis not present

## 2018-01-04 DIAGNOSIS — H40832 Aqueous misdirection, left eye: Secondary | ICD-10-CM | POA: Diagnosis not present

## 2018-01-04 DIAGNOSIS — H268 Other specified cataract: Secondary | ICD-10-CM | POA: Diagnosis not present

## 2018-02-20 DIAGNOSIS — H2513 Age-related nuclear cataract, bilateral: Secondary | ICD-10-CM | POA: Diagnosis not present

## 2018-02-20 DIAGNOSIS — H268 Other specified cataract: Secondary | ICD-10-CM | POA: Diagnosis not present

## 2018-02-20 DIAGNOSIS — H40832 Aqueous misdirection, left eye: Secondary | ICD-10-CM | POA: Diagnosis not present

## 2018-04-14 DIAGNOSIS — H40832 Aqueous misdirection, left eye: Secondary | ICD-10-CM | POA: Diagnosis not present

## 2018-04-14 DIAGNOSIS — H2512 Age-related nuclear cataract, left eye: Secondary | ICD-10-CM | POA: Diagnosis not present

## 2018-04-14 DIAGNOSIS — H27122 Anterior dislocation of lens, left eye: Secondary | ICD-10-CM | POA: Diagnosis not present

## 2018-04-20 DIAGNOSIS — I2510 Atherosclerotic heart disease of native coronary artery without angina pectoris: Secondary | ICD-10-CM | POA: Diagnosis not present

## 2018-04-20 DIAGNOSIS — Z9189 Other specified personal risk factors, not elsewhere classified: Secondary | ICD-10-CM | POA: Diagnosis not present

## 2018-04-20 DIAGNOSIS — H26102 Unspecified traumatic cataract, left eye: Secondary | ICD-10-CM | POA: Diagnosis not present

## 2018-04-20 DIAGNOSIS — H25812 Combined forms of age-related cataract, left eye: Secondary | ICD-10-CM | POA: Diagnosis not present

## 2018-04-20 DIAGNOSIS — I10 Essential (primary) hypertension: Secondary | ICD-10-CM | POA: Diagnosis not present

## 2018-05-02 DIAGNOSIS — H35371 Puckering of macula, right eye: Secondary | ICD-10-CM | POA: Diagnosis not present

## 2018-05-02 DIAGNOSIS — H268 Other specified cataract: Secondary | ICD-10-CM | POA: Diagnosis not present

## 2018-05-02 DIAGNOSIS — H2512 Age-related nuclear cataract, left eye: Secondary | ICD-10-CM | POA: Diagnosis not present

## 2018-05-23 DIAGNOSIS — H40832 Aqueous misdirection, left eye: Secondary | ICD-10-CM | POA: Diagnosis not present

## 2018-05-23 DIAGNOSIS — Z9189 Other specified personal risk factors, not elsewhere classified: Secondary | ICD-10-CM | POA: Diagnosis not present

## 2018-05-23 DIAGNOSIS — H2702 Aphakia, left eye: Secondary | ICD-10-CM | POA: Diagnosis not present

## 2018-05-23 DIAGNOSIS — T8522XA Displacement of intraocular lens, initial encounter: Secondary | ICD-10-CM | POA: Diagnosis not present

## 2018-05-23 DIAGNOSIS — I2510 Atherosclerotic heart disease of native coronary artery without angina pectoris: Secondary | ICD-10-CM | POA: Diagnosis not present

## 2018-05-23 DIAGNOSIS — I10 Essential (primary) hypertension: Secondary | ICD-10-CM | POA: Diagnosis not present

## 2018-05-24 DIAGNOSIS — H268 Other specified cataract: Secondary | ICD-10-CM | POA: Diagnosis not present

## 2018-05-24 DIAGNOSIS — Z961 Presence of intraocular lens: Secondary | ICD-10-CM | POA: Diagnosis not present

## 2018-05-24 DIAGNOSIS — H4322 Crystalline deposits in vitreous body, left eye: Secondary | ICD-10-CM | POA: Diagnosis not present

## 2018-05-24 DIAGNOSIS — H2511 Age-related nuclear cataract, right eye: Secondary | ICD-10-CM | POA: Diagnosis not present

## 2018-05-24 DIAGNOSIS — H1820 Unspecified corneal edema: Secondary | ICD-10-CM | POA: Diagnosis not present

## 2018-05-24 DIAGNOSIS — Z888 Allergy status to other drugs, medicaments and biological substances status: Secondary | ICD-10-CM | POA: Diagnosis not present

## 2018-05-24 DIAGNOSIS — H43812 Vitreous degeneration, left eye: Secondary | ICD-10-CM | POA: Diagnosis not present

## 2018-05-24 DIAGNOSIS — Z48810 Encounter for surgical aftercare following surgery on the sense organs: Secondary | ICD-10-CM | POA: Diagnosis not present

## 2018-05-24 DIAGNOSIS — H40832 Aqueous misdirection, left eye: Secondary | ICD-10-CM | POA: Diagnosis not present

## 2018-05-24 DIAGNOSIS — Z885 Allergy status to narcotic agent status: Secondary | ICD-10-CM | POA: Diagnosis not present

## 2018-06-20 DIAGNOSIS — H35371 Puckering of macula, right eye: Secondary | ICD-10-CM | POA: Diagnosis not present

## 2018-06-20 DIAGNOSIS — H2512 Age-related nuclear cataract, left eye: Secondary | ICD-10-CM | POA: Diagnosis not present

## 2018-06-20 DIAGNOSIS — H268 Other specified cataract: Secondary | ICD-10-CM | POA: Diagnosis not present

## 2018-08-09 DIAGNOSIS — Z0000 Encounter for general adult medical examination without abnormal findings: Secondary | ICD-10-CM | POA: Diagnosis not present

## 2018-08-21 DIAGNOSIS — G2581 Restless legs syndrome: Secondary | ICD-10-CM | POA: Diagnosis not present

## 2018-08-21 DIAGNOSIS — R5382 Chronic fatigue, unspecified: Secondary | ICD-10-CM | POA: Diagnosis not present

## 2018-08-21 DIAGNOSIS — E559 Vitamin D deficiency, unspecified: Secondary | ICD-10-CM | POA: Diagnosis not present

## 2018-08-21 DIAGNOSIS — D519 Vitamin B12 deficiency anemia, unspecified: Secondary | ICD-10-CM | POA: Diagnosis not present

## 2018-08-24 DIAGNOSIS — M2140 Flat foot [pes planus] (acquired), unspecified foot: Secondary | ICD-10-CM | POA: Diagnosis not present

## 2018-08-24 DIAGNOSIS — L309 Dermatitis, unspecified: Secondary | ICD-10-CM | POA: Diagnosis not present

## 2018-08-24 DIAGNOSIS — Z0001 Encounter for general adult medical examination with abnormal findings: Secondary | ICD-10-CM | POA: Diagnosis not present

## 2018-08-24 DIAGNOSIS — D519 Vitamin B12 deficiency anemia, unspecified: Secondary | ICD-10-CM | POA: Diagnosis not present

## 2018-08-24 DIAGNOSIS — E559 Vitamin D deficiency, unspecified: Secondary | ICD-10-CM | POA: Diagnosis not present

## 2018-12-26 DIAGNOSIS — H2512 Age-related nuclear cataract, left eye: Secondary | ICD-10-CM | POA: Diagnosis not present

## 2018-12-26 DIAGNOSIS — H268 Other specified cataract: Secondary | ICD-10-CM | POA: Diagnosis not present

## 2018-12-26 DIAGNOSIS — H35372 Puckering of macula, left eye: Secondary | ICD-10-CM | POA: Diagnosis not present

## 2018-12-26 DIAGNOSIS — H40832 Aqueous misdirection, left eye: Secondary | ICD-10-CM | POA: Diagnosis not present

## 2019-01-03 DIAGNOSIS — M1711 Unilateral primary osteoarthritis, right knee: Secondary | ICD-10-CM | POA: Diagnosis not present

## 2019-01-03 DIAGNOSIS — M25461 Effusion, right knee: Secondary | ICD-10-CM | POA: Diagnosis not present

## 2019-02-03 DIAGNOSIS — Z1231 Encounter for screening mammogram for malignant neoplasm of breast: Secondary | ICD-10-CM | POA: Diagnosis not present

## 2019-02-19 DIAGNOSIS — Z961 Presence of intraocular lens: Secondary | ICD-10-CM | POA: Diagnosis not present

## 2019-02-19 DIAGNOSIS — H40832 Aqueous misdirection, left eye: Secondary | ICD-10-CM | POA: Diagnosis not present

## 2019-02-19 DIAGNOSIS — H2511 Age-related nuclear cataract, right eye: Secondary | ICD-10-CM | POA: Diagnosis not present

## 2019-02-19 DIAGNOSIS — Z9842 Cataract extraction status, left eye: Secondary | ICD-10-CM | POA: Diagnosis not present

## 2019-02-19 DIAGNOSIS — H268 Other specified cataract: Secondary | ICD-10-CM | POA: Diagnosis not present

## 2019-02-22 ENCOUNTER — Telehealth (HOSPITAL_COMMUNITY): Payer: Self-pay | Admitting: *Deleted

## 2019-02-22 NOTE — Telephone Encounter (Signed)
RESENT [place]

## 2019-02-22 NOTE — Telephone Encounter (Signed)
You put the note under her chart, it needs to go in [place] chart

## 2019-02-22 NOTE — Telephone Encounter (Signed)
DR Raylene Miyamoto GRANDMOTHER CALLED STATING THE VM  LEFT TOLD TO CALL THE OFFICE. INFORMED I WOULD SEND THE MESSAGE THAT YOU ARE WORKING FROM HOME  & IN THE MIDDLE OF HAVING YOUR APPOINTMENT'S WITH SCHEDULED PATIENT'S

## 2019-06-05 ENCOUNTER — Other Ambulatory Visit: Payer: Self-pay | Admitting: *Deleted

## 2019-06-05 DIAGNOSIS — Z20822 Contact with and (suspected) exposure to covid-19: Secondary | ICD-10-CM

## 2019-06-07 LAB — NOVEL CORONAVIRUS, NAA: SARS-CoV-2, NAA: NOT DETECTED

## 2019-06-08 ENCOUNTER — Telehealth: Payer: Self-pay | Admitting: Family Medicine

## 2019-06-08 NOTE — Telephone Encounter (Signed)
Negative COVID results given. Patient results "NOT Detected." Caller expressed understanding. ° °

## 2019-08-21 DIAGNOSIS — E559 Vitamin D deficiency, unspecified: Secondary | ICD-10-CM | POA: Diagnosis not present

## 2019-08-21 DIAGNOSIS — G2581 Restless legs syndrome: Secondary | ICD-10-CM | POA: Diagnosis not present

## 2019-08-21 DIAGNOSIS — R5382 Chronic fatigue, unspecified: Secondary | ICD-10-CM | POA: Diagnosis not present

## 2019-08-21 DIAGNOSIS — D519 Vitamin B12 deficiency anemia, unspecified: Secondary | ICD-10-CM | POA: Diagnosis not present

## 2019-08-28 DIAGNOSIS — M19072 Primary osteoarthritis, left ankle and foot: Secondary | ICD-10-CM | POA: Diagnosis not present

## 2019-08-28 DIAGNOSIS — Z23 Encounter for immunization: Secondary | ICD-10-CM | POA: Diagnosis not present

## 2019-08-28 DIAGNOSIS — E559 Vitamin D deficiency, unspecified: Secondary | ICD-10-CM | POA: Diagnosis not present

## 2019-08-28 DIAGNOSIS — M2041 Other hammer toe(s) (acquired), right foot: Secondary | ICD-10-CM | POA: Diagnosis not present

## 2019-08-28 DIAGNOSIS — M19071 Primary osteoarthritis, right ankle and foot: Secondary | ICD-10-CM | POA: Diagnosis not present

## 2019-08-28 DIAGNOSIS — H8190 Unspecified disorder of vestibular function, unspecified ear: Secondary | ICD-10-CM | POA: Diagnosis not present

## 2019-08-28 DIAGNOSIS — Z0001 Encounter for general adult medical examination with abnormal findings: Secondary | ICD-10-CM | POA: Diagnosis not present

## 2019-08-28 DIAGNOSIS — D519 Vitamin B12 deficiency anemia, unspecified: Secondary | ICD-10-CM | POA: Diagnosis not present
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
## 2019-08-29 DIAGNOSIS — D519 Vitamin B12 deficiency anemia, unspecified: Secondary | ICD-10-CM | POA: Diagnosis not present

## 2019-08-30 DIAGNOSIS — D519 Vitamin B12 deficiency anemia, unspecified: Secondary | ICD-10-CM | POA: Diagnosis not present

## 2019-09-03 DIAGNOSIS — D519 Vitamin B12 deficiency anemia, unspecified: Secondary | ICD-10-CM | POA: Diagnosis not present

## 2019-09-04 DIAGNOSIS — D519 Vitamin B12 deficiency anemia, unspecified: Secondary | ICD-10-CM | POA: Diagnosis not present

## 2019-09-11 DIAGNOSIS — D519 Vitamin B12 deficiency anemia, unspecified: Secondary | ICD-10-CM | POA: Diagnosis not present

## 2019-09-20 DIAGNOSIS — E538 Deficiency of other specified B group vitamins: Secondary | ICD-10-CM | POA: Diagnosis not present

## 2019-09-28 DIAGNOSIS — D519 Vitamin B12 deficiency anemia, unspecified: Secondary | ICD-10-CM | POA: Diagnosis not present

## 2019-10-05 DIAGNOSIS — E538 Deficiency of other specified B group vitamins: Secondary | ICD-10-CM | POA: Diagnosis not present

## 2019-10-31 DIAGNOSIS — E538 Deficiency of other specified B group vitamins: Secondary | ICD-10-CM | POA: Diagnosis not present

## 2019-11-20 DIAGNOSIS — D519 Vitamin B12 deficiency anemia, unspecified: Secondary | ICD-10-CM | POA: Diagnosis not present

## 2019-11-20 DIAGNOSIS — E559 Vitamin D deficiency, unspecified: Secondary | ICD-10-CM | POA: Diagnosis not present

## 2019-11-20 DIAGNOSIS — R5382 Chronic fatigue, unspecified: Secondary | ICD-10-CM | POA: Diagnosis not present

## 2019-11-28 DIAGNOSIS — D519 Vitamin B12 deficiency anemia, unspecified: Secondary | ICD-10-CM | POA: Diagnosis not present

## 2019-11-28 DIAGNOSIS — R5382 Chronic fatigue, unspecified: Secondary | ICD-10-CM | POA: Diagnosis not present

## 2019-11-28 DIAGNOSIS — M19071 Primary osteoarthritis, right ankle and foot: Secondary | ICD-10-CM | POA: Diagnosis not present

## 2019-11-28 DIAGNOSIS — E559 Vitamin D deficiency, unspecified: Secondary | ICD-10-CM | POA: Diagnosis not present

## 2019-12-25 DIAGNOSIS — E538 Deficiency of other specified B group vitamins: Secondary | ICD-10-CM | POA: Diagnosis not present

## 2020-01-02 DIAGNOSIS — Z9842 Cataract extraction status, left eye: Secondary | ICD-10-CM | POA: Diagnosis not present

## 2020-01-02 DIAGNOSIS — H268 Other specified cataract: Secondary | ICD-10-CM | POA: Diagnosis not present

## 2020-01-02 DIAGNOSIS — H40832 Aqueous misdirection, left eye: Secondary | ICD-10-CM | POA: Diagnosis not present

## 2020-01-02 DIAGNOSIS — H2511 Age-related nuclear cataract, right eye: Secondary | ICD-10-CM | POA: Diagnosis not present

## 2020-01-02 DIAGNOSIS — H35372 Puckering of macula, left eye: Secondary | ICD-10-CM | POA: Diagnosis not present

## 2020-01-22 DIAGNOSIS — E538 Deficiency of other specified B group vitamins: Secondary | ICD-10-CM | POA: Diagnosis not present

## 2020-02-05 DIAGNOSIS — M546 Pain in thoracic spine: Secondary | ICD-10-CM | POA: Diagnosis not present

## 2020-02-19 DIAGNOSIS — E538 Deficiency of other specified B group vitamins: Secondary | ICD-10-CM | POA: Diagnosis not present

## 2020-03-08 DIAGNOSIS — Z1231 Encounter for screening mammogram for malignant neoplasm of breast: Secondary | ICD-10-CM | POA: Diagnosis not present

## 2020-03-17 DIAGNOSIS — E538 Deficiency of other specified B group vitamins: Secondary | ICD-10-CM | POA: Diagnosis not present

## 2020-04-07 DIAGNOSIS — M19071 Primary osteoarthritis, right ankle and foot: Secondary | ICD-10-CM | POA: Diagnosis not present

## 2020-04-07 DIAGNOSIS — M1711 Unilateral primary osteoarthritis, right knee: Secondary | ICD-10-CM | POA: Diagnosis not present

## 2020-04-07 DIAGNOSIS — M79604 Pain in right leg: Secondary | ICD-10-CM | POA: Diagnosis not present

## 2020-04-07 DIAGNOSIS — M79605 Pain in left leg: Secondary | ICD-10-CM | POA: Diagnosis not present

## 2020-04-17 DIAGNOSIS — E538 Deficiency of other specified B group vitamins: Secondary | ICD-10-CM | POA: Diagnosis not present

## 2020-04-28 DIAGNOSIS — M1711 Unilateral primary osteoarthritis, right knee: Secondary | ICD-10-CM | POA: Diagnosis not present

## 2020-04-28 DIAGNOSIS — M79604 Pain in right leg: Secondary | ICD-10-CM | POA: Diagnosis not present

## 2020-04-28 DIAGNOSIS — M79605 Pain in left leg: Secondary | ICD-10-CM | POA: Diagnosis not present

## 2020-04-28 DIAGNOSIS — M19071 Primary osteoarthritis, right ankle and foot: Secondary | ICD-10-CM | POA: Diagnosis not present

## 2020-05-19 DIAGNOSIS — E538 Deficiency of other specified B group vitamins: Secondary | ICD-10-CM | POA: Diagnosis not present

## 2020-05-26 DIAGNOSIS — Z13220 Encounter for screening for lipoid disorders: Secondary | ICD-10-CM | POA: Diagnosis not present

## 2020-05-26 DIAGNOSIS — D518 Other vitamin B12 deficiency anemias: Secondary | ICD-10-CM | POA: Diagnosis not present

## 2020-05-26 DIAGNOSIS — R5382 Chronic fatigue, unspecified: Secondary | ICD-10-CM | POA: Diagnosis not present

## 2020-05-26 DIAGNOSIS — E782 Mixed hyperlipidemia: Secondary | ICD-10-CM | POA: Diagnosis not present

## 2020-05-26 DIAGNOSIS — E559 Vitamin D deficiency, unspecified: Secondary | ICD-10-CM | POA: Diagnosis not present

## 2020-05-29 DIAGNOSIS — M79604 Pain in right leg: Secondary | ICD-10-CM | POA: Diagnosis not present

## 2020-05-29 DIAGNOSIS — E559 Vitamin D deficiency, unspecified: Secondary | ICD-10-CM | POA: Diagnosis not present

## 2020-05-29 DIAGNOSIS — Z0001 Encounter for general adult medical examination with abnormal findings: Secondary | ICD-10-CM | POA: Diagnosis not present

## 2020-05-29 DIAGNOSIS — M19071 Primary osteoarthritis, right ankle and foot: Secondary | ICD-10-CM | POA: Diagnosis not present

## 2020-05-29 DIAGNOSIS — D519 Vitamin B12 deficiency anemia, unspecified: Secondary | ICD-10-CM | POA: Diagnosis not present

## 2020-07-02 DIAGNOSIS — E538 Deficiency of other specified B group vitamins: Secondary | ICD-10-CM | POA: Diagnosis not present

## 2020-08-06 DIAGNOSIS — J209 Acute bronchitis, unspecified: Secondary | ICD-10-CM | POA: Diagnosis not present

## 2020-08-14 DIAGNOSIS — E538 Deficiency of other specified B group vitamins: Secondary | ICD-10-CM | POA: Diagnosis not present

## 2020-08-25 DIAGNOSIS — J209 Acute bronchitis, unspecified: Secondary | ICD-10-CM | POA: Diagnosis not present

## 2020-08-25 DIAGNOSIS — R059 Cough, unspecified: Secondary | ICD-10-CM | POA: Diagnosis not present

## 2020-09-25 DIAGNOSIS — E538 Deficiency of other specified B group vitamins: Secondary | ICD-10-CM | POA: Diagnosis not present

## 2020-10-14 DIAGNOSIS — M1711 Unilateral primary osteoarthritis, right knee: Secondary | ICD-10-CM | POA: Diagnosis not present

## 2020-10-15 DIAGNOSIS — Z961 Presence of intraocular lens: Secondary | ICD-10-CM | POA: Diagnosis not present

## 2020-10-15 DIAGNOSIS — H40832 Aqueous misdirection, left eye: Secondary | ICD-10-CM | POA: Diagnosis not present

## 2020-10-15 DIAGNOSIS — H2511 Age-related nuclear cataract, right eye: Secondary | ICD-10-CM | POA: Diagnosis not present

## 2020-10-15 DIAGNOSIS — Z9842 Cataract extraction status, left eye: Secondary | ICD-10-CM | POA: Diagnosis not present

## 2020-10-15 DIAGNOSIS — H268 Other specified cataract: Secondary | ICD-10-CM | POA: Diagnosis not present

## 2020-10-23 DIAGNOSIS — M25561 Pain in right knee: Secondary | ICD-10-CM | POA: Diagnosis not present

## 2020-11-04 DIAGNOSIS — E538 Deficiency of other specified B group vitamins: Secondary | ICD-10-CM | POA: Diagnosis not present

## 2020-11-14 DIAGNOSIS — E559 Vitamin D deficiency, unspecified: Secondary | ICD-10-CM | POA: Diagnosis not present

## 2020-11-14 DIAGNOSIS — R5382 Chronic fatigue, unspecified: Secondary | ICD-10-CM | POA: Diagnosis not present

## 2020-11-14 DIAGNOSIS — D518 Other vitamin B12 deficiency anemias: Secondary | ICD-10-CM | POA: Diagnosis not present

## 2020-11-19 DIAGNOSIS — E559 Vitamin D deficiency, unspecified: Secondary | ICD-10-CM | POA: Diagnosis not present

## 2020-11-19 DIAGNOSIS — L309 Dermatitis, unspecified: Secondary | ICD-10-CM | POA: Diagnosis not present

## 2020-11-19 DIAGNOSIS — D519 Vitamin B12 deficiency anemia, unspecified: Secondary | ICD-10-CM | POA: Diagnosis not present

## 2020-11-19 DIAGNOSIS — M19071 Primary osteoarthritis, right ankle and foot: Secondary | ICD-10-CM | POA: Diagnosis not present

## 2020-12-16 DIAGNOSIS — E538 Deficiency of other specified B group vitamins: Secondary | ICD-10-CM | POA: Diagnosis not present

## 2021-01-27 DIAGNOSIS — E538 Deficiency of other specified B group vitamins: Secondary | ICD-10-CM | POA: Diagnosis not present

## 2021-02-25 DIAGNOSIS — R002 Palpitations: Secondary | ICD-10-CM | POA: Diagnosis not present

## 2021-02-25 DIAGNOSIS — U099 Post covid-19 condition, unspecified: Secondary | ICD-10-CM | POA: Diagnosis not present

## 2021-02-25 DIAGNOSIS — R059 Cough, unspecified: Secondary | ICD-10-CM | POA: Diagnosis not present

## 2021-02-25 DIAGNOSIS — R5383 Other fatigue: Secondary | ICD-10-CM | POA: Diagnosis not present

## 2021-03-04 ENCOUNTER — Telehealth: Payer: Self-pay

## 2021-03-04 NOTE — Telephone Encounter (Signed)
NOTES ON FILE FROM  [facility] [phone] SENT REFERRAL TO SCHEDULING

## 2021-03-10 DIAGNOSIS — R059 Cough, unspecified: Secondary | ICD-10-CM | POA: Diagnosis not present

## 2021-03-10 DIAGNOSIS — R002 Palpitations: Secondary | ICD-10-CM | POA: Diagnosis not present

## 2021-03-10 DIAGNOSIS — H9202 Otalgia, left ear: Secondary | ICD-10-CM | POA: Diagnosis not present

## 2021-03-10 DIAGNOSIS — D518 Other vitamin B12 deficiency anemias: Secondary | ICD-10-CM | POA: Diagnosis not present

## 2021-03-10 DIAGNOSIS — U099 Post covid-19 condition, unspecified: Secondary | ICD-10-CM | POA: Diagnosis not present

## 2021-03-21 DIAGNOSIS — Z1231 Encounter for screening mammogram for malignant neoplasm of breast: Secondary | ICD-10-CM | POA: Diagnosis not present

## 2021-04-21 DIAGNOSIS — E538 Deficiency of other specified B group vitamins: Secondary | ICD-10-CM | POA: Diagnosis not present

## 2021-05-11 NOTE — Progress Notes (Signed)
Cardiology Office Note   Date:  05/12/2021   ID:  Anna Flynn, DOB Feb 06, 1941, MRN [id number]  PCP:  Curlene Labrum, MD  Cardiologist:   None Referring:  Denny Levy, PA  Chief Complaint  Patient presents with   Abnormal ECG      History of Present Illness: Anna Flynn is a 80 y.o. female who presents for evaluation of palpitations.  She had COVID in July.  She has had regular follow-up with her primary provider and had an EKG that demonstrated atrial ectopy.  Computer suggested atrial fibrillation but there is clearly sinus rhythm with runs of atrial tachycardia.  She does not feel palpitations.  She does not have presyncope or syncope.  She has no chest pressure, neck or arm discomfort.  She has no shortness of breath, PND or orthopnea.  She lives with her husband.  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   She pushes a vacuum cleaner  Then she remains very active otherwise.  The patient had cardiac work-up years ago in 2011 with an equivocal stress echo apparently for chest pain.  I cannot find the cath report but she reports it was normal.  She has no other cardiac history or work-up.   Past Medical History:  Diagnosis Date   Cataract    07/27/17   Eye trauma    06/2017    Past Surgical History:  Procedure Laterality Date   CHOLECYSTECTOMY     LEFT THUMB A1 PULLEY RELEASE       Current Outpatient Medications  Medication Sig Dispense Refill   ibuprofen (ADVIL) 200 MG tablet Take 200 mg by mouth as needed.     No current facility-administered medications for this visit.    Allergies:   Codeine and Nabumetone    Social History:  The patient  reports that she has never smoked. She has never used smokeless tobacco. She reports that she does not drink alcohol and does not use drugs.   Family History:  The patient's family history includes Cancer in her brother, father, mother,  and sister.    ROS:  Please see the history of present illness.   Otherwise, review of systems are positive for none.   All other systems are reviewed and negative.    PHYSICAL EXAM: VS:  BP (!) 150/84   Pulse 78   Ht '5\' 4"'$  (1.626 m)   Wt 175 lb (79.4 kg)   SpO2 96%   BMI 30.04 kg/m  , BMI Body mass index is 30.04 kg/m. GENERAL:  Well appearing HEENT:  Pupils equal round and reactive, fundi not visualized, oral mucosa unremarkable NECK:  No jugular venous distention, waveform within normal limits, carotid upstroke brisk and symmetric, no bruits, no thyromegaly LYMPHATICS:  No cervical, inguinal adenopathy LUNGS:  Clear to auscultation bilaterally BACK:  No CVA tenderness CHEST:  Unremarkable HEART:  PMI not displaced or sustained,S1 and S2 within normal limits, no S3, no S4, no clicks, no rubs, no murmurs ABD:  Flat, positive bowel sounds normal in frequency in pitch, no bruits, no rebound, no guarding, no midline pulsatile mass, no hepatomegaly, no splenomegaly EXT:  2 plus pulses throughout, no edema, no cyanosis no clubbing SKIN:  No rashes no nodules NEURO:  Cranial nerves II through XII grossly intact, motor grossly intact throughout PSYCH:  Cognitively intact, oriented to person place and time    EKG:  EKG is ordered today. The ekg ordered today demonstrates sinus rhythm, rate 78, axis within normal limits, intervals within normal limits poor anterior R wave progression, no acute ST-T wave changes.   Recent Labs: No results found for requested labs within last 8760 hours.    Lipid Panel No results found for: CHOL, TRIG, HDL, CHOLHDL, VLDL, LDLCALC, LDLDIRECT    Wt Readings from Last 3 Encounters:  05/12/21 175 lb (79.4 kg)      Other studies Reviewed: Additional studies/ records that were reviewed today include: Office records and EKG. Review of the above records demonstrates:  Please see elsewhere in the note.     ASSESSMENT AND PLAN:  Abnormal EKG: The  patient had a question of atrial fibrillation but I do not see this.  She is not really feeling any symptoms.  I will check a TSH and a 2-week event monitor.  In the absence of symptoms that there is no diagnostic arrhythmia then no other change in therapy is indicated.  Hypertension: I have asked her to check her blood pressure more frequently at home.  She reports that is usually well controlled and that this is unusual.  She has not checked every 6 weeks at doctors appointments.  She has never been told it was high.   Current medicines are reviewed at length with the patient today.  The patient does not have concerns regarding medicines.  The following changes have been made:  no change  Labs/ tests ordered today include:   Orders Placed This Encounter  Procedures   TSH   EKG 12-Lead      Disposition:   FU with me as needed and based on the results of the above.   Signed, Minus Breeding, MD  05/12/2021 2:23 PM    [place]

## 2021-05-12 ENCOUNTER — Ambulatory Visit: Payer: Medicare Other | Admitting: Cardiology

## 2021-05-12 ENCOUNTER — Encounter: Payer: Self-pay | Admitting: *Deleted

## 2021-05-12 ENCOUNTER — Encounter: Payer: Self-pay | Admitting: Cardiology

## 2021-05-12 ENCOUNTER — Other Ambulatory Visit: Payer: Self-pay

## 2021-05-12 VITALS — BP 150/84 | HR 78 | Ht 64.0 in | Wt 175.0 lb

## 2021-05-12 DIAGNOSIS — R002 Palpitations: Secondary | ICD-10-CM | POA: Diagnosis not present

## 2021-05-12 DIAGNOSIS — R9431 Abnormal electrocardiogram [ECG] [EKG]: Secondary | ICD-10-CM | POA: Diagnosis not present

## 2021-05-12 NOTE — Progress Notes (Signed)
Patient ID: Anna Flynn, female   DOB: 08/06/1941, 80 y.o.   MRN: [id number] Patient enrolled for [employer] to ship a 14 day Cardiac Event Monitor to her address on file.

## 2021-05-12 NOTE — Patient Instructions (Addendum)
Medication Instructions:  Your physician recommends that you continue on your current medications as directed. Please refer to the Current Medication list given to you today.   Labwork: TSH TODAY   Testing/Procedures: Your physician has recommended that you wear an event monitor. Event monitors are medical devices that record the heart's electrical activity. Doctors most often [place] these monitors to diagnose arrhythmias. Arrhythmias are problems with the speed or rhythm of the heartbeat. The monitor is a small, portable device. You can wear one while you do your normal daily activities. This is usually used to diagnose what is causing palpitations/syncope (passing out). 2 WEEKS   Follow-Up: AS NEEDED   Any Other Special Instructions Will Be Listed Below (If Applicable).  [employer] Cardiac Event Monitor Instructions Your physician has requested you wear your cardiac event monitor for 14 days, (1-30). [employer] may call or text to confirm a shipping address. The monitor will be sent to a land address via UPS. [employer] will not ship a monitor to a PO BOX. It typically takes 3-5 days to receive your monitor after it has been enrolled. [employer] will assist with USPS tracking if your package is delayed. The telephone number for [employer] is [phone]. Once you have received your monitor, please review the enclosed instructions. Instruction tutorials can also be viewed under help and settings on the enclosed cell phone. Your monitor has already been registered assigning a specific monitor serial # to you.  Applying the monitor Remove cell phone from case and turn it on. The cell phone works as Dealer and needs to be within [employer] of you at all times. The cell phone will need to be charged on a daily basis. We recommend you plug the cell phone into the enclosed charger at your bedside table every night.  Monitor batteries: You will receive two monitor batteries labelled #1  and #2. These are your recorders. Plug battery #2 onto the second connection on the enclosed charger. Keep one battery on the charger at all times. This will keep the monitor battery deactivated. It will also keep it fully charged for when you need to switch your monitor batteries. A small light will be blinking on the battery emblem when it is charging. The light on the battery emblem will remain on when the battery is fully charged.  Open package of a Monitor strip. Insert battery #1 into black hood on strip and gently squeeze monitor battery onto connection as indicated in instruction booklet. Set aside while preparing skin.  Choose location for your strip, vertical or horizontal, as indicated in the instruction booklet. Shave to remove all hair from location. There cannot be any lotions, oils, powders, or colognes on skin where monitor is to be applied. Wipe skin clean with enclosed Saline wipe. Dry skin completely.  Peel paper labeled #1 off the back of the Monitor strip exposing the adhesive. Place the monitor on the chest in the vertical or horizontal position shown in the instruction booklet. One arrow on the monitor strip must be pointing upward. Carefully remove paper labeled #2, attaching remainder of strip to your skin. Try not to create any folds or wrinkles in the strip as you apply it.  Firmly press and release the circle in the center of the monitor battery. You will hear a small beep. This is turning the monitor battery on. The heart emblem on the monitor battery will light up every 5 seconds if the monitor battery in turned on and connected to the patient  securely. Do not push and hold the circle down as this turns the monitor battery off. The cell phone will locate the monitor battery. A screen will appear on the cell phone checking the connection of your monitor strip. This may read poor connection initially but change to good connection within the next minute. Once your  monitor accepts the connection you will hear a series of 3 beeps followed by a climbing crescendo of beeps. A screen will appear on the cell phone showing the two monitor strip placement options. Touch the picture that demonstrates where you applied the monitor strip.  Your monitor strip and battery are waterproof. You are able to shower, bathe, or swim with the monitor on. They just ask you do not submerge deeper than 3 feet underwater. We recommend removing the monitor if you are swimming in a lake, river, or ocean.  Your monitor battery will need to be switched to a fully charged monitor battery approximately once a week. The cell phone will alert you of an action which needs to be made.  On the cell phone, tap for details to reveal connection status, monitor battery status, and cell phone battery status. The green dots indicates your monitor is in good status. A red dot indicates there is something that needs your attention.  To record a symptom, click the circle on the monitor battery. In 30-60 seconds a list of symptoms will appear on the cell phone. Select your symptom and tap save. Your monitor will record a sustained or significant arrhythmia regardless of you clicking the button. Some patients do not feel the heart rhythm irregularities. [employer] will notify us of any serious or critical events.  Refer to instruction booklet for instructions on switching batteries, changing strips, the Do not disturb or Pause features, or any additional questions.  Call [employer] at [phone], to confirm your monitor is transmitting and record your baseline. They will answer any questions you may have regarding the monitor instructions at that time.  Returning the monitor to [place] all equipment back into blue box. Peel off strip of paper to expose adhesive and close box securely. There is a prepaid UPS shipping label on this box. Drop in a UPS drop box, or at a UPS facility  like [employer]. You may also contact [employer] to arrange UPS to pick up monitor package at your home.

## 2021-05-13 LAB — TSH: TSH: 2.01 u[IU]/mL (ref 0.450–4.500)

## 2021-05-15 ENCOUNTER — Telehealth: Payer: Self-pay | Admitting: Cardiology

## 2021-05-15 NOTE — Telephone Encounter (Signed)
Pt updated with lab results and verbalized understanding.    Minus Breeding, MD  05/13/2021  6:54 PM EDT     TSH is normal.  Call Ms. Maurer with the results and send results to [place], Virgina Evener, MD

## 2021-05-15 NOTE — Telephone Encounter (Signed)
Pt is returning phone call for results please advise

## 2021-05-19 ENCOUNTER — Ambulatory Visit (INDEPENDENT_AMBULATORY_CARE_PROVIDER_SITE_OTHER): Payer: Medicare Other

## 2021-05-19 DIAGNOSIS — R9431 Abnormal electrocardiogram [ECG] [EKG]: Secondary | ICD-10-CM

## 2021-05-19 DIAGNOSIS — R002 Palpitations: Secondary | ICD-10-CM | POA: Diagnosis not present

## 2021-05-21 DIAGNOSIS — Z13220 Encounter for screening for lipoid disorders: Secondary | ICD-10-CM | POA: Diagnosis not present

## 2021-05-21 DIAGNOSIS — D518 Other vitamin B12 deficiency anemias: Secondary | ICD-10-CM | POA: Diagnosis not present

## 2021-05-21 DIAGNOSIS — E559 Vitamin D deficiency, unspecified: Secondary | ICD-10-CM | POA: Diagnosis not present

## 2021-05-21 DIAGNOSIS — Z1329 Encounter for screening for other suspected endocrine disorder: Secondary | ICD-10-CM | POA: Diagnosis not present

## 2021-05-24 ENCOUNTER — Telehealth: Payer: Self-pay | Admitting: Cardiology

## 2021-05-24 NOTE — Telephone Encounter (Signed)
Pt had 11 beats of NSVT at 1:46 pm today Sunday.  I called pt and she was not aware of any rapid heart rate, no chest pain or dizziness.  They should have sent strips to office.

## 2021-05-26 ENCOUNTER — Telehealth: Payer: Self-pay | Admitting: *Deleted

## 2021-05-26 DIAGNOSIS — Z8616 Personal history of COVID-19: Secondary | ICD-10-CM | POA: Diagnosis not present

## 2021-05-26 DIAGNOSIS — M19072 Primary osteoarthritis, left ankle and foot: Secondary | ICD-10-CM | POA: Diagnosis not present

## 2021-05-26 DIAGNOSIS — E559 Vitamin D deficiency, unspecified: Secondary | ICD-10-CM | POA: Diagnosis not present

## 2021-05-26 DIAGNOSIS — Z0001 Encounter for general adult medical examination with abnormal findings: Secondary | ICD-10-CM | POA: Diagnosis not present

## 2021-05-26 DIAGNOSIS — D519 Vitamin B12 deficiency anemia, unspecified: Secondary | ICD-10-CM | POA: Diagnosis not present

## 2021-05-26 DIAGNOSIS — R002 Palpitations: Secondary | ICD-10-CM | POA: Diagnosis not present

## 2021-05-26 DIAGNOSIS — M19071 Primary osteoarthritis, right ankle and foot: Secondary | ICD-10-CM | POA: Diagnosis not present

## 2021-05-26 NOTE — Telephone Encounter (Signed)
Received critical recording from [employer] from 05/26/21 at 1:17 am CST showing sinus rhythm w/Run of V -Tach ( 9 beats) MF PVC's (10 in 1 min) PACs . Continue to monitor per Dr Gwenlyn Found .

## 2021-05-26 NOTE — Telephone Encounter (Signed)
Attempted to call pt no answer no voiemail set up could not leave message will try later ./cy

## 2021-05-27 NOTE — Telephone Encounter (Signed)
Per pt asymptomatic will continue to monitor Per pt will wear monitor until Tuesday 06/02/21 .Adonis Housekeeper

## 2021-05-28 ENCOUNTER — Telehealth: Payer: Self-pay | Admitting: Home Health

## 2021-05-28 NOTE — Telephone Encounter (Signed)
[employer] called after-hours line reporting patient had 12 beats of ventricular tachycardia at a rate of 165bpm, at 552pm Central time.  Underlying was sinus rhythm at 90s, patient had 18 PVCs including ventricular bigeminy and mid multifocal PVCs.  They did call the patient however patient did not answer the phone. Report has been faxed to Dr Percival Spanish office.   Called patient at [phone].  Spoke directly to the patient.  Patient states she was trying to make her grandson behave, who has autism and was acting out earlier.  She recalls being emotionally upset, but denied any chest pain, chest pressure, shortness of breath, dizziness, syncope or near syncope or heart palpitation.  She is feeling well and states she is supposed to take of the monitor Monday.   Chart reviewed, she was placed on monitor for detection of atrial fibrillation on 05/12/2021. At this time as she is asymptomatic, will forward this message to Dr Percival Spanish for reviewing faxed strips before deciding further office appointment needed.

## 2021-06-02 DIAGNOSIS — E538 Deficiency of other specified B group vitamins: Secondary | ICD-10-CM | POA: Diagnosis not present

## 2021-06-05 ENCOUNTER — Other Ambulatory Visit: Payer: Self-pay | Admitting: Cardiology

## 2021-06-05 DIAGNOSIS — R002 Palpitations: Secondary | ICD-10-CM

## 2021-06-05 DIAGNOSIS — R9431 Abnormal electrocardiogram [ECG] [EKG]: Secondary | ICD-10-CM

## 2021-06-11 ENCOUNTER — Telehealth: Payer: Self-pay | Admitting: *Deleted

## 2021-06-11 DIAGNOSIS — R002 Palpitations: Secondary | ICD-10-CM

## 2021-06-11 NOTE — Telephone Encounter (Addendum)
-----   Message from Minus Breeding, MD sent at 06/07/2021 11:44 AM EDT ----- I did not see any atrial fibrillation.  There was lots of ventricular ectopy however with nonsustained ventricular tachycardia.  There were some atrial tachycardia.  The next step will be an echocardiogram and then likely an ischemia evaluation to evaluate the ectopy.  Please call with results and order the echocardiogram thank you.    Unable to reach pt or leave a message

## 2021-06-11 NOTE — Telephone Encounter (Signed)
Spoke with pt, aware of results. She would like to have echo in the [place] office. Number to call to schedule given to the patient.

## 2021-07-14 DIAGNOSIS — E538 Deficiency of other specified B group vitamins: Secondary | ICD-10-CM | POA: Diagnosis not present

## 2021-07-20 ENCOUNTER — Other Ambulatory Visit: Payer: Self-pay

## 2021-07-20 ENCOUNTER — Ambulatory Visit (HOSPITAL_COMMUNITY)
Admission: RE | Admit: 2021-07-20 | Discharge: 2021-07-20 | Disposition: A | Discharge status: Home / Self Care | Payer: Medicare Other | Source: Ambulatory Visit | Attending: Cardiology | Admitting: Cardiology

## 2021-07-20 DIAGNOSIS — R9431 Abnormal electrocardiogram [ECG] [EKG]: Secondary | ICD-10-CM

## 2021-07-20 DIAGNOSIS — Z8616 Personal history of COVID-19: Secondary | ICD-10-CM | POA: Insufficient documentation

## 2021-07-20 DIAGNOSIS — R002 Palpitations: Secondary | ICD-10-CM | POA: Insufficient documentation

## 2021-07-20 DIAGNOSIS — I340 Nonrheumatic mitral (valve) insufficiency: Secondary | ICD-10-CM | POA: Insufficient documentation

## 2021-07-20 DIAGNOSIS — I10 Essential (primary) hypertension: Secondary | ICD-10-CM | POA: Insufficient documentation

## 2021-07-20 LAB — ECHOCARDIOGRAM COMPLETE
AR max vel: 2.01 cm2
AV Area VTI: 2.46 cm2
AV Area mean vel: 1.72 cm2
AV Mean grad: 2.6 mmHg
AV Peak grad: 4.5 mmHg
Ao pk vel: 1.06 m/s
S' Lateral: 3.9 cm

## 2021-07-20 NOTE — Progress Notes (Signed)
*  PRELIMINARY RESULTS* Echocardiogram 2D Echocardiogram has been performed.  Anna Flynn 07/20/2021, 1:58 PM

## 2021-07-22 ENCOUNTER — Telehealth: Payer: Self-pay | Admitting: Cardiology

## 2021-07-22 NOTE — Telephone Encounter (Signed)
Patient is returning call to discuss echo results. °

## 2021-07-22 NOTE — Telephone Encounter (Signed)
Spoke to patient echo results given.Appointment scheduled with Dr.Hochrein at [facility] office 09/16/21 at 4:00 pm.

## 2021-08-26 DIAGNOSIS — E538 Deficiency of other specified B group vitamins: Secondary | ICD-10-CM | POA: Diagnosis not present

## 2021-09-01 DIAGNOSIS — M7062 Trochanteric bursitis, left hip: Secondary | ICD-10-CM | POA: Diagnosis not present

## 2021-09-01 DIAGNOSIS — M25552 Pain in left hip: Secondary | ICD-10-CM | POA: Diagnosis not present

## 2021-09-15 DIAGNOSIS — I4729 Other ventricular tachycardia: Secondary | ICD-10-CM | POA: Insufficient documentation

## 2021-09-15 NOTE — Progress Notes (Signed)
Cardiology Office Note   Date:  09/16/2021   ID:  Anna Flynn, DOB 04-30-1941, MRN [id number]  PCP:  Curlene Labrum, MD  Cardiologist:   None Referring:  Curlene Labrum, MD  Chief Complaint  Patient presents with   Palpitations      History of Present Illness: Anna Flynn is a 81 y.o. female who presents for evaluation of palpitations.  She has had regular follow-up with her primary provider and had an EKG that demonstrated atrial ectopy.  Computer suggested atrial fibrillation but there is clearly sinus rhythm with runs of atrial tachycardia.    The patient had cardiac work-up years ago in 2011 with an equivocal stress echo apparently for chest pain.  I cannot find the cath report but she reports it was normal.  She has no other cardiac history or work-up.  After the last visit I sent her for an echo which was unremarkable.  She did have NSVT and ventricular ectopy.  There was no atrial fib.  She returns for follow-up.  60% of the burden was atrial and ventricular ectopy.  She really did not feel any of this.  She does not feel any palpitations, presyncope or syncope.  She does not have any shortness of breath, PND or orthopnea.  She has no weight gain or edema.  She takes care of her great  grandson who is 98 years old and has autism and oppositional defiant disorder.   Past Medical History:  Diagnosis Date   Cataract    07/27/17   Eye trauma    06/2017    Past Surgical History:  Procedure Laterality Date   CHOLECYSTECTOMY     LEFT THUMB A1 PULLEY RELEASE       Current Outpatient Medications  Medication Sig Dispense Refill   ibuprofen (ADVIL) 200 MG tablet Take 200 mg by mouth as needed.     No current facility-administered medications for this visit.    Allergies:   Codeine and Nabumetone    ROS:  Please see the history of present illness.   Otherwise, review of systems are positive for none.   All other systems are reviewed and negative.     PHYSICAL EXAM: VS:  BP (!) 144/84 (BP Location: Right Arm, Patient Position: Sitting, Cuff Size: Normal)    Pulse 93    Ht 5' 4.5" (1.638 m)    Wt 183 lb 9.6 oz (83.3 kg)    SpO2 96%    BMI 31.03 kg/m  , BMI Body mass index is 31.03 kg/m. GENERAL:  Well appearing NECK:  No jugular venous distention, waveform within normal limits, carotid upstroke brisk and symmetric, no bruits, no thyromegaly LUNGS:  Clear to auscultation bilaterally CHEST:  Unremarkable HEART:  PMI not displaced or sustained,S1 and S2 within normal limits, no S3, no S4, no clicks, no rubs, no murmurs ABD:  Flat, positive bowel sounds normal in frequency in pitch, no bruits, no rebound, no guarding, no midline pulsatile mass, no hepatomegaly, no splenomegaly EXT:  2 plus pulses throughout, no edema, no cyanosis no clubbing   EKG:  EKG is  not ordered today.    Recent Labs: 05/12/2021: TSH 2.010    Lipid Panel No results found for: CHOL, TRIG, HDL, CHOLHDL, VLDL, LDLCALC, LDLDIRECT    Wt Readings from Last 3 Encounters:  09/16/21 183 lb 9.6 oz (83.3 kg)  05/12/21 175 lb (79.4 kg)      Other studies Reviewed: Additional studies/ records that  were reviewed today include: None.   Review of the above records demonstrates:  Please see elsewhere in the note.     ASSESSMENT AND PLAN:  Abnormal EKG:   She does have frequent ectopy but does not have any symptoms and would prefer not to take any medications.  She will let me know if she has any increasing palpitations going forward.   Hypertension:   Her blood pressure is mildly elevated.  They are going to keep a blood pressure diary.  I have asked her to check her blood pressure more frequently at home.  She reports that is usually well controlled and that this is unusual.  She has not checked every 6 weeks at doctors appointments.  She has never been told it was high.   Current medicines are reviewed at length with the patient today.  The patient does not have  concerns regarding medicines.  The following changes have been made:  None  Labs/ tests ordered today include: None  No orders of the defined types were placed in this encounter.     Disposition:   FU with me 6 months.    Signed, Minus Breeding, MD  09/16/2021 8:40 PM    [facility]

## 2021-09-16 ENCOUNTER — Ambulatory Visit (INDEPENDENT_AMBULATORY_CARE_PROVIDER_SITE_OTHER): Payer: Medicare Other | Admitting: Cardiology

## 2021-09-16 ENCOUNTER — Other Ambulatory Visit: Payer: Self-pay

## 2021-09-16 ENCOUNTER — Encounter: Payer: Self-pay | Admitting: Cardiology

## 2021-09-16 VITALS — BP 144/84 | HR 93 | Ht 64.5 in | Wt 183.6 lb

## 2021-09-16 DIAGNOSIS — I4729 Other ventricular tachycardia: Secondary | ICD-10-CM | POA: Diagnosis not present

## 2021-09-16 DIAGNOSIS — I10 Essential (primary) hypertension: Secondary | ICD-10-CM

## 2021-09-16 DIAGNOSIS — R9431 Abnormal electrocardiogram [ECG] [EKG]: Secondary | ICD-10-CM

## 2021-09-16 NOTE — Patient Instructions (Signed)
Medication Instructions:  The current medical regimen is effective;  continue present plan and medications.  *If you need a refill on your cardiac medications before your next appointment, please call your pharmacy*  Follow-Up: At [facility], you and your health needs are our priority.  As part of our continuing mission to provide you with exceptional heart care, we have created designated Provider Care Teams.  These Care Teams include your primary Cardiologist (physician) and Advanced Practice Providers (APPs -  Physician Assistants and Nurse Practitioners) who all work together to provide you with the care you need, when you need it.  We recommend signing up for the patient portal called "MyChart".  Sign up information is provided on this After Visit Summary.  MyChart is used to connect with patients for Virtual Visits (Telemedicine).  Patients are able to view lab/test results, encounter notes, upcoming appointments, etc.  Non-urgent messages can be sent to your provider as well.   To learn more about what you can do with MyChart, go to [email or web address].    Your next appointment:   6 month(s)  The format for your next appointment:   In Person  Provider:   James Hochrein, MD   Thank you for choosing [facility]!!     

## 2021-09-29 DIAGNOSIS — R030 Elevated blood-pressure reading, without diagnosis of hypertension: Secondary | ICD-10-CM | POA: Diagnosis not present

## 2021-09-29 DIAGNOSIS — H8110 Benign paroxysmal vertigo, unspecified ear: Secondary | ICD-10-CM | POA: Diagnosis not present

## 2021-10-08 DIAGNOSIS — E538 Deficiency of other specified B group vitamins: Secondary | ICD-10-CM | POA: Diagnosis not present

## 2021-10-10 DIAGNOSIS — I10 Essential (primary) hypertension: Secondary | ICD-10-CM | POA: Diagnosis not present

## 2021-10-10 DIAGNOSIS — R42 Dizziness and giddiness: Secondary | ICD-10-CM | POA: Diagnosis not present

## 2021-10-10 DIAGNOSIS — R000 Tachycardia, unspecified: Secondary | ICD-10-CM | POA: Diagnosis not present

## 2021-10-20 DIAGNOSIS — R42 Dizziness and giddiness: Secondary | ICD-10-CM | POA: Diagnosis not present

## 2021-10-20 DIAGNOSIS — M9901 Segmental and somatic dysfunction of cervical region: Secondary | ICD-10-CM | POA: Diagnosis not present

## 2021-10-20 DIAGNOSIS — M47812 Spondylosis without myelopathy or radiculopathy, cervical region: Secondary | ICD-10-CM | POA: Diagnosis not present

## 2021-10-22 DIAGNOSIS — M47812 Spondylosis without myelopathy or radiculopathy, cervical region: Secondary | ICD-10-CM | POA: Diagnosis not present

## 2021-10-22 DIAGNOSIS — R42 Dizziness and giddiness: Secondary | ICD-10-CM | POA: Diagnosis not present

## 2021-10-22 DIAGNOSIS — M9901 Segmental and somatic dysfunction of cervical region: Secondary | ICD-10-CM | POA: Diagnosis not present

## 2021-10-29 DIAGNOSIS — R42 Dizziness and giddiness: Secondary | ICD-10-CM | POA: Diagnosis not present

## 2021-10-29 DIAGNOSIS — M47812 Spondylosis without myelopathy or radiculopathy, cervical region: Secondary | ICD-10-CM | POA: Diagnosis not present

## 2021-10-29 DIAGNOSIS — M9901 Segmental and somatic dysfunction of cervical region: Secondary | ICD-10-CM | POA: Diagnosis not present

## 2021-11-12 DIAGNOSIS — E559 Vitamin D deficiency, unspecified: Secondary | ICD-10-CM | POA: Diagnosis not present

## 2021-11-12 DIAGNOSIS — I10 Essential (primary) hypertension: Secondary | ICD-10-CM | POA: Diagnosis not present

## 2021-11-12 DIAGNOSIS — D518 Other vitamin B12 deficiency anemias: Secondary | ICD-10-CM | POA: Diagnosis not present

## 2021-11-19 DIAGNOSIS — E538 Deficiency of other specified B group vitamins: Secondary | ICD-10-CM | POA: Diagnosis not present

## 2021-11-25 DIAGNOSIS — E559 Vitamin D deficiency, unspecified: Secondary | ICD-10-CM | POA: Diagnosis not present

## 2021-11-25 DIAGNOSIS — Z1389 Encounter for screening for other disorder: Secondary | ICD-10-CM | POA: Diagnosis not present

## 2021-11-25 DIAGNOSIS — D519 Vitamin B12 deficiency anemia, unspecified: Secondary | ICD-10-CM | POA: Diagnosis not present

## 2021-11-25 DIAGNOSIS — Z8616 Personal history of COVID-19: Secondary | ICD-10-CM | POA: Diagnosis not present

## 2021-11-25 DIAGNOSIS — M19071 Primary osteoarthritis, right ankle and foot: Secondary | ICD-10-CM | POA: Diagnosis not present

## 2021-11-25 DIAGNOSIS — I10 Essential (primary) hypertension: Secondary | ICD-10-CM | POA: Diagnosis not present

## 2021-11-26 DIAGNOSIS — R000 Tachycardia, unspecified: Secondary | ICD-10-CM | POA: Diagnosis not present

## 2021-11-26 DIAGNOSIS — D518 Other vitamin B12 deficiency anemias: Secondary | ICD-10-CM | POA: Diagnosis not present

## 2021-11-26 DIAGNOSIS — I10 Essential (primary) hypertension: Secondary | ICD-10-CM | POA: Diagnosis not present

## 2021-11-26 DIAGNOSIS — R002 Palpitations: Secondary | ICD-10-CM | POA: Diagnosis not present

## 2021-11-26 DIAGNOSIS — Z0001 Encounter for general adult medical examination with abnormal findings: Secondary | ICD-10-CM | POA: Diagnosis not present

## 2021-12-17 DIAGNOSIS — D518 Other vitamin B12 deficiency anemias: Secondary | ICD-10-CM | POA: Diagnosis not present

## 2022-01-28 DIAGNOSIS — I10 Essential (primary) hypertension: Secondary | ICD-10-CM | POA: Diagnosis not present

## 2022-01-28 DIAGNOSIS — D518 Other vitamin B12 deficiency anemias: Secondary | ICD-10-CM | POA: Diagnosis not present

## 2022-01-28 DIAGNOSIS — Z13220 Encounter for screening for lipoid disorders: Secondary | ICD-10-CM | POA: Diagnosis not present

## 2022-01-28 DIAGNOSIS — Z1329 Encounter for screening for other suspected endocrine disorder: Secondary | ICD-10-CM | POA: Diagnosis not present

## 2022-02-02 DIAGNOSIS — D519 Vitamin B12 deficiency anemia, unspecified: Secondary | ICD-10-CM | POA: Diagnosis not present

## 2022-02-02 DIAGNOSIS — M19071 Primary osteoarthritis, right ankle and foot: Secondary | ICD-10-CM | POA: Diagnosis not present

## 2022-02-02 DIAGNOSIS — I10 Essential (primary) hypertension: Secondary | ICD-10-CM | POA: Diagnosis not present

## 2022-02-02 DIAGNOSIS — R002 Palpitations: Secondary | ICD-10-CM | POA: Diagnosis not present

## 2022-03-15 DIAGNOSIS — E538 Deficiency of other specified B group vitamins: Secondary | ICD-10-CM | POA: Diagnosis not present

## 2022-04-10 DIAGNOSIS — Z1231 Encounter for screening mammogram for malignant neoplasm of breast: Secondary | ICD-10-CM | POA: Diagnosis not present

## 2022-04-16 DIAGNOSIS — R922 Inconclusive mammogram: Secondary | ICD-10-CM | POA: Diagnosis not present

## 2022-04-16 DIAGNOSIS — R928 Other abnormal and inconclusive findings on diagnostic imaging of breast: Secondary | ICD-10-CM | POA: Diagnosis not present

## 2022-04-26 DIAGNOSIS — D518 Other vitamin B12 deficiency anemias: Secondary | ICD-10-CM | POA: Diagnosis not present

## 2022-06-01 DIAGNOSIS — I10 Essential (primary) hypertension: Secondary | ICD-10-CM | POA: Diagnosis not present

## 2022-06-01 DIAGNOSIS — E7849 Other hyperlipidemia: Secondary | ICD-10-CM | POA: Diagnosis not present

## 2022-06-01 DIAGNOSIS — R5383 Other fatigue: Secondary | ICD-10-CM | POA: Diagnosis not present

## 2022-06-01 DIAGNOSIS — E559 Vitamin D deficiency, unspecified: Secondary | ICD-10-CM | POA: Diagnosis not present

## 2022-06-01 DIAGNOSIS — D519 Vitamin B12 deficiency anemia, unspecified: Secondary | ICD-10-CM | POA: Diagnosis not present

## 2022-06-04 DIAGNOSIS — D519 Vitamin B12 deficiency anemia, unspecified: Secondary | ICD-10-CM | POA: Diagnosis not present

## 2022-06-04 DIAGNOSIS — E559 Vitamin D deficiency, unspecified: Secondary | ICD-10-CM | POA: Diagnosis not present

## 2022-06-04 DIAGNOSIS — R002 Palpitations: Secondary | ICD-10-CM | POA: Diagnosis not present

## 2022-06-04 DIAGNOSIS — M19071 Primary osteoarthritis, right ankle and foot: Secondary | ICD-10-CM | POA: Diagnosis not present

## 2022-06-04 DIAGNOSIS — I10 Essential (primary) hypertension: Secondary | ICD-10-CM | POA: Diagnosis not present

## 2022-06-08 DIAGNOSIS — S61012A Laceration without foreign body of left thumb without damage to nail, initial encounter: Secondary | ICD-10-CM | POA: Diagnosis not present

## 2022-06-08 DIAGNOSIS — Z23 Encounter for immunization: Secondary | ICD-10-CM | POA: Diagnosis not present

## 2022-06-20 NOTE — Progress Notes (Unsigned)
Cardiology Office Note   Date:  06/23/2022   ID:  Anna Flynn, DOB 09-16-1940, MRN [id number]  PCP:  Curlene Labrum, MD  Cardiologist:   None Referring:  Curlene Labrum, MD  Chief Complaint  Patient presents with   Shortness of Breath      History of Present Illness: Anna Flynn is a 81 y.o. female who presents for evaluation of palpitations.  She has had regular follow-up with her primary provider and had an EKG that demonstrated atrial ectopy.  Computer suggested atrial fibrillation but there is clearly sinus rhythm with runs of atrial tachycardia.  The patient had cardiac work-up years ago in 2011 with an equivocal stress echo apparently for chest pain.  I cannot find the cath report but she reports it was normal.  Echocardiogram was unremarkable.    She returns for follow-up.  She has been having some shortness of breath.  This has been for about 3 months.  She has to walk less than a block to her daughter's house up a very slight incline and she is short of breath and has to relax for maybe a couple of minutes for that will go away.  She is not describing any resting shortness of breath and she is not having any PND or orthopnea.  She has not had symptoms like this before.  She has not had any chest pressure, neck or arm discomfort.  She had no weight gain or edema.  She does occasionally feel the palpitations but she does not think they are worse than before.  She has not had any presyncope or syncope.   Past Medical History:  Diagnosis Date   Cataract    07/27/17   Eye trauma    06/2017    Past Surgical History:  Procedure Laterality Date   CHOLECYSTECTOMY     LEFT THUMB A1 PULLEY RELEASE       Current Outpatient Medications  Medication Sig Dispense Refill   acetaminophen (TYLENOL) 650 MG CR tablet Take 650 mg by mouth every 8 (eight) hours as needed for pain.     chlorthalidone (HYGROTON) 25 MG tablet Take 25 mg by mouth daily.     ibuprofen (ADVIL)  200 MG tablet Take 200 mg by mouth as needed.     metoprolol succinate (TOPROL-XL) 25 MG 24 hr tablet Take 12.5 mg by mouth daily.     No current facility-administered medications for this visit.    Allergies:   Codeine and Nabumetone    ROS:  Please see the history of present illness.   Otherwise, review of systems are positive for none.   All other systems are reviewed and negative.    PHYSICAL EXAM: VS:  BP 116/70   Pulse 74   Ht 5' 4.5" (1.638 m)   Wt 181 lb (82.1 kg)   BMI 30.59 kg/m  , BMI Body mass index is 30.59 kg/m. GENERAL:  Well appearing NECK:  No jugular venous distention, waveform within normal limits, carotid upstroke brisk and symmetric, no bruits, no thyromegaly LUNGS:  Clear to auscultation bilaterally CHEST:  Well healed sternotomy scar. HEART:  PMI not displaced or sustained,S1 and S2 within normal limits, no S3, no S4, no clicks, no rubs, no murmurs ABD:  Flat, positive bowel sounds normal in frequency in pitch, no bruits, no rebound, no guarding, no midline pulsatile mass, no hepatomegaly, no splenomegaly EXT:  2 plus pulses throughout, mild leg edema, no cyanosis no clubbing  EKG:  EKG is  ordered today. Sinus rhythm, rate 74, premature atrial contractions, low voltage in the limb and chest leads, nonspecific T wave flattening, no change from previous.   Recent Labs: No results found for requested labs within last 365 days.    Lipid Panel No results found for: "CHOL", "TRIG", "HDL", "CHOLHDL", "VLDL", "LDLCALC", "LDLDIRECT"    Wt Readings from Last 3 Encounters:  06/23/22 181 lb (82.1 kg)  09/16/21 183 lb 9.6 oz (83.3 kg)  05/12/21 175 lb (79.4 kg)      Other studies Reviewed: Additional studies/ records that were reviewed today include: Labs .   Review of the above records demonstrates:  Please see elsewhere in the note.     ASSESSMENT AND PLAN:  SOB: The patient's shortness of breath could be multifactorial and related to age and  deconditioning.  However, she does have cardiovascular risk factors and so I will screen her with stress testing.  She would be able to walk on a treadmill.  Therefore, I would like for her to have a  PET.    Palpitations: She had no evidence of atrial fibrillation while it before.  She has no change in her symptoms.  No change in therapy.   Hypertension:   Her blood pressure is mildly elevated and she was started on chlorthalidone since I saw her.  She is going to keep a blood pressure diary as she is only been taking it at 7 in the morning.  Further management will be per Dr. Pleas Koch based on the results of that blood pressure diary.    Current medicines are reviewed at length with the patient today.  The patient does not have concerns regarding medicines.  The following changes have been made:  None  Labs/ tests ordered today include:   Orders Placed This Encounter  Procedures   NM PET CT CARDIAC PERFUSION MULTI W/ABSOLUTE BLOODFLOW   EKG 12-Lead      Disposition:   FU with me as needed based on the results of the above.     Signed, Minus Breeding, MD  06/23/2022 5:09 PM    [facility]

## 2022-06-23 ENCOUNTER — Encounter: Payer: Self-pay | Admitting: Cardiology

## 2022-06-23 ENCOUNTER — Ambulatory Visit: Payer: Medicare Other | Admitting: Cardiology

## 2022-06-23 VITALS — BP 116/70 | HR 74 | Ht 64.5 in | Wt 181.0 lb

## 2022-06-23 DIAGNOSIS — R9431 Abnormal electrocardiogram [ECG] [EKG]: Secondary | ICD-10-CM

## 2022-06-23 DIAGNOSIS — R0602 Shortness of breath: Secondary | ICD-10-CM | POA: Diagnosis not present

## 2022-06-23 DIAGNOSIS — I10 Essential (primary) hypertension: Secondary | ICD-10-CM | POA: Diagnosis not present

## 2022-06-23 NOTE — Patient Instructions (Signed)
Medication Instructions:  The current medical regimen is effective;  continue present plan and medications.  *If you need a refill on your cardiac medications before your next appointment, please call your pharmacy*  Testing/Procedures: How to Prepare for Your Cardiac PET/CT Stress Test:  1. Please do not take these medications before your test:   Medications that may interfere with the cardiac pharmacological stress agent (ex. nitrates - including erectile dysfunction medications or beta-blockers) the day of the exam. (Erectile dysfunction medication should be held for at least 72 hrs prior to test) Theophylline containing medications for 12 hours. Dipyridamole 48 hours prior to the test. Your remaining medications may be taken with water.  2. Nothing to eat or drink, except water, 3 hours prior to arrival time.   NO caffeine/decaffeinated products, or chocolate 12 hours prior to arrival.  3. NO perfume, cologne or lotion  4. Total time is 1 to 2 hours; you may want to bring reading material for the waiting time.  5. Please report to Admitting at the [facility] Main Entrance 60 minutes early for your test.  [place], [place] [address]  Diabetic Preparation:  Hold oral medications. You may take NPH and Lantus insulin. Do not take Humalog or Humulin R (Regular Insulin) the day of your test. Check blood sugars prior to leaving the house. If able to eat breakfast prior to 3 hour fasting, you may take all medications, including your insulin, Do not worry if you miss your breakfast dose of insulin - start at your next meal.  IF YOU THINK YOU MAY BE PREGNANT, OR ARE NURSING PLEASE INFORM THE TECHNOLOGIST.  In preparation for your appointment, medication and supplies will be purchased.  Appointment availability is limited, so if you need to cancel or reschedule, please call the Radiology Department at [phone]  24 hours in advance to avoid a cancellation  fee of $100.00  What to Expect After you Arrive:  Once you arrive and check in for your appointment, you will be taken to a preparation room within the Radiology Department.  A technologist or Nurse will obtain your medical history, verify that you are correctly prepped for the exam, and explain the procedure.  Afterwards,  an IV will be started in your arm and electrodes will be placed on your skin for EKG monitoring during the stress portion of the exam. Then you will be escorted to the PET/CT scanner.  There, staff will get you positioned on the scanner and obtain a blood pressure and EKG.  During the exam, you will continue to be connected to the EKG and blood pressure machines.  A small, safe amount of a radioactive tracer will be injected in your IV to obtain a series of pictures of your heart along with an injection of a stress agent.    After your Exam:  It is recommended that you eat a meal and drink a caffeinated beverage to counter act any effects of the stress agent.  Drink plenty of fluids for the remainder of the day and urinate frequently for the first couple of hours after the exam.  Your doctor will inform you of your test results within 7-10 business days.  For questions about your test or how to prepare for your test, please call: Marchia Bond, Cardiac Imaging Nurse Navigator  Gordy Clement, Cardiac Imaging Nurse Navigator Office: [phone]   Follow-Up: At [facility], you and your health needs are our priority.  As part of  our continuing mission to provide you with exceptional heart care, we have created designated Provider Care Teams.  These Care Teams include your primary Cardiologist (physician) and Advanced Practice Providers (APPs -  Physician Assistants and Nurse Practitioners) who all work together to provide you with the care you need, when you need it.  We recommend signing up for the patient portal called "MyChart".  Sign up information is provided on  this After Visit Summary.  MyChart is used to connect with patients for Virtual Visits (Telemedicine).  Patients are able to view lab/test results, encounter notes, upcoming appointments, etc.  Non-urgent messages can be sent to your provider as well.   To learn more about what you can do with MyChart, go to [email or web address].    Your next appointment:   Follow up will be based on the results of the above testing.   Important Information About Sugar

## 2022-07-16 DIAGNOSIS — E538 Deficiency of other specified B group vitamins: Secondary | ICD-10-CM | POA: Diagnosis not present

## 2022-08-17 DIAGNOSIS — E538 Deficiency of other specified B group vitamins: Secondary | ICD-10-CM | POA: Diagnosis not present

## 2022-08-26 DIAGNOSIS — R059 Cough, unspecified: Secondary | ICD-10-CM | POA: Diagnosis not present

## 2022-08-26 DIAGNOSIS — R6889 Other general symptoms and signs: Secondary | ICD-10-CM | POA: Diagnosis not present

## 2022-08-27 DIAGNOSIS — E860 Dehydration: Secondary | ICD-10-CM | POA: Diagnosis not present

## 2022-08-27 DIAGNOSIS — I10 Essential (primary) hypertension: Secondary | ICD-10-CM | POA: Diagnosis not present

## 2022-08-27 DIAGNOSIS — E559 Vitamin D deficiency, unspecified: Secondary | ICD-10-CM | POA: Diagnosis not present

## 2022-08-27 DIAGNOSIS — D519 Vitamin B12 deficiency anemia, unspecified: Secondary | ICD-10-CM | POA: Diagnosis not present

## 2022-08-27 DIAGNOSIS — Z1329 Encounter for screening for other suspected endocrine disorder: Secondary | ICD-10-CM | POA: Diagnosis not present

## 2022-08-27 DIAGNOSIS — R42 Dizziness and giddiness: Secondary | ICD-10-CM | POA: Diagnosis not present

## 2022-09-02 ENCOUNTER — Telehealth: Payer: Self-pay | Admitting: Cardiology

## 2022-09-02 DIAGNOSIS — R002 Palpitations: Secondary | ICD-10-CM | POA: Diagnosis not present

## 2022-09-02 DIAGNOSIS — D519 Vitamin B12 deficiency anemia, unspecified: Secondary | ICD-10-CM | POA: Diagnosis not present

## 2022-09-02 DIAGNOSIS — I10 Essential (primary) hypertension: Secondary | ICD-10-CM | POA: Diagnosis not present

## 2022-09-02 DIAGNOSIS — E876 Hypokalemia: Secondary | ICD-10-CM | POA: Diagnosis not present

## 2022-09-02 DIAGNOSIS — E559 Vitamin D deficiency, unspecified: Secondary | ICD-10-CM | POA: Diagnosis not present

## 2022-09-02 NOTE — Telephone Encounter (Signed)
Calling to see if the CT for the patient is still need, if so. Calling to get it schd. Please advise

## 2022-09-02 NOTE — Telephone Encounter (Signed)
Returned call to [place] Angela Nevin) advised her that the scheduling for Cardiac PET is pretty far out but will send a message to our PET department to follow up on getting this scheduled for the patient. Carla voiced understanding. Will forward message over.

## 2022-09-13 DIAGNOSIS — J209 Acute bronchitis, unspecified: Secondary | ICD-10-CM | POA: Diagnosis not present

## 2022-09-16 DIAGNOSIS — E876 Hypokalemia: Secondary | ICD-10-CM | POA: Diagnosis not present

## 2022-09-29 DIAGNOSIS — H268 Other specified cataract: Secondary | ICD-10-CM | POA: Diagnosis not present

## 2022-09-29 DIAGNOSIS — H2511 Age-related nuclear cataract, right eye: Secondary | ICD-10-CM | POA: Diagnosis not present

## 2022-09-29 DIAGNOSIS — Z961 Presence of intraocular lens: Secondary | ICD-10-CM | POA: Diagnosis not present

## 2022-09-29 DIAGNOSIS — H40832 Aqueous misdirection, left eye: Secondary | ICD-10-CM | POA: Diagnosis not present

## 2022-12-03 DIAGNOSIS — I10 Essential (primary) hypertension: Secondary | ICD-10-CM | POA: Diagnosis not present

## 2022-12-03 DIAGNOSIS — E559 Vitamin D deficiency, unspecified: Secondary | ICD-10-CM | POA: Diagnosis not present

## 2022-12-03 DIAGNOSIS — D518 Other vitamin B12 deficiency anemias: Secondary | ICD-10-CM | POA: Diagnosis not present

## 2022-12-03 DIAGNOSIS — Z131 Encounter for screening for diabetes mellitus: Secondary | ICD-10-CM | POA: Diagnosis not present

## 2022-12-03 DIAGNOSIS — E876 Hypokalemia: Secondary | ICD-10-CM | POA: Diagnosis not present

## 2022-12-09 DIAGNOSIS — R002 Palpitations: Secondary | ICD-10-CM | POA: Diagnosis not present

## 2022-12-09 DIAGNOSIS — I10 Essential (primary) hypertension: Secondary | ICD-10-CM | POA: Diagnosis not present

## 2022-12-09 DIAGNOSIS — E876 Hypokalemia: Secondary | ICD-10-CM | POA: Diagnosis not present

## 2022-12-09 DIAGNOSIS — Z0001 Encounter for general adult medical examination with abnormal findings: Secondary | ICD-10-CM | POA: Diagnosis not present

## 2022-12-09 DIAGNOSIS — E559 Vitamin D deficiency, unspecified: Secondary | ICD-10-CM | POA: Diagnosis not present

## 2023-01-27 ENCOUNTER — Telehealth: Payer: Self-pay | Admitting: Cardiology

## 2023-01-27 DIAGNOSIS — R0602 Shortness of breath: Secondary | ICD-10-CM

## 2023-01-27 NOTE — Telephone Encounter (Signed)
Anna Flynn was in question as to why noone ever called her about scheduling the -NM PET CT CARDIAC PERFUSION. She stated that noone called her to let her know that it had been cancelled. Does she need follow up with Dr. Antoine Poche?

## 2023-01-27 NOTE — Telephone Encounter (Signed)
Patient stated she never received call to get her PET CT. She would like to know if she still needs to have it done. Dr. Antoine Poche ordered it in October on her last OV. She states of its not necessary she would rather not have it done of she don't need it.

## 2023-01-28 NOTE — Telephone Encounter (Signed)
Spoke with pt, aware of dr hochrein's recommendations. She is very upset that it has been 6 months. Aware the previous order was cancelled, not sure why. Will contact the folks that schedule PET's and see if we can get done sooner.

## 2023-06-03 DIAGNOSIS — I10 Essential (primary) hypertension: Secondary | ICD-10-CM | POA: Diagnosis not present

## 2023-06-03 DIAGNOSIS — E559 Vitamin D deficiency, unspecified: Secondary | ICD-10-CM | POA: Diagnosis not present

## 2023-06-03 DIAGNOSIS — D518 Other vitamin B12 deficiency anemias: Secondary | ICD-10-CM | POA: Diagnosis not present

## 2023-06-03 DIAGNOSIS — E876 Hypokalemia: Secondary | ICD-10-CM | POA: Diagnosis not present

## 2023-06-03 DIAGNOSIS — M79604 Pain in right leg: Secondary | ICD-10-CM | POA: Diagnosis not present

## 2023-06-07 ENCOUNTER — Telehealth: Payer: Self-pay | Admitting: Cardiology

## 2023-06-07 NOTE — Telephone Encounter (Signed)
[name] from [facility] is calling because at the last appointment with doctor Hochrein (06/23/22). The patient was supposed to have a PET Scan. The request for the PET Scan was closed and [name] would like to know why and if the patient still needs to complete this test. Please advise.

## 2023-06-08 NOTE — Telephone Encounter (Signed)
Left message for Anna Flynn, according to the scheduler the PET scan was cancelled because it was denied by insurance.

## 2023-06-09 DIAGNOSIS — Z1231 Encounter for screening mammogram for malignant neoplasm of breast: Secondary | ICD-10-CM | POA: Diagnosis not present

## 2023-06-09 NOTE — Telephone Encounter (Signed)
Spoke with Anna Flynn, the patient is still having issues with chest pain. Will call and get the patient scheduled for an appointment.

## 2023-06-16 NOTE — Telephone Encounter (Signed)
Spoke with pt, she is very upset that no one called her regarding getting a PET scan. Explained to the patient it was cancelled and I do not know why. Explained her medical doctor wanted her to be seen for continued SOB. Tried to answer her questions as best as I could. Follow up scheduled

## 2023-07-03 DIAGNOSIS — R0602 Shortness of breath: Secondary | ICD-10-CM | POA: Insufficient documentation

## 2023-07-03 NOTE — Progress Notes (Unsigned)
  Cardiology Office Note:   Date:  07/06/2023  ID:  Anna Flynn, DOB February 16, 1941, MRN [id number] PCP: Juliette Alcide, MD  [facility] Providers Cardiologist:  None {  History of Present Illness:   Anna Flynn is a 82 y.o. female who presents for evaluation of palpitations.  She has had regular follow-up with her primary provider and had an EKG that demonstrated atrial ectopy.  Computer suggested atrial fibrillation but there is clearly sinus rhythm with runs of atrial tachycardia.  The patient had cardiac work-up years ago in 2011 with an equivocal stress echo apparently for chest pain.  I cannot find the cath report but she reports it was normal.  Echocardiogram was unremarkable.     She returns for follow-up.   She was to have a PET .  However, we had trouble scheduling that.  She has since had resolution of her shortness of breath.  She was feeling poorly but recently had her beta-blocker and chlorthalidone discontinued and says she feels much better.  She is less fatigued.  She is not noticing any palpitations, presyncope or syncope.  She has had no chest pain.  She has no shortness of breath.  She has a lot of stress taking care of autistic grandson.  ROS: As stated in the HPI and negative for all other systems.  Studies Reviewed:    EKG:   EKG Interpretation Date/Time:  Wednesday July 06 2023 15:12:45 EST Ventricular Rate:  83 PR Interval:  136 QRS Duration:  84 QT Interval:  382 QTC Calculation: 448 R Axis:   40  Text Interpretation: Sinus rhythm with sinus arrhythmia with occasional Premature ventricular complexes Low voltage QRS Poor anterior R wave progression When compared with ECG of 28-Apr-2006 09:11, Premature ventricular complexes are now Present QRS voltage has decreased Confirmed by Rollene Rotunda ([id number]) on 07/06/2023 3:48:50 PM     Risk Assessment/Calculations:      Physical Exam:   VS:  BP (!) 142/78   Pulse 83   Ht 5\' 4"  (1.626 m)   Wt 178  lb (80.7 kg)   BMI 30.55 kg/m    Wt Readings from Last 3 Encounters:  07/06/23 178 lb (80.7 kg)  06/23/22 181 lb (82.1 kg)  09/16/21 183 lb 9.6 oz (83.3 kg)     GEN: Well nourished, well developed in no acute distress NECK: No JVD; No carotid bruits CARDIAC: RRR, no murmurs, rubs, gallops RESPIRATORY:  Clear to auscultation without rales, wheezing or rhonchi  ABDOMEN: Soft, non-tender, non-distended EXTREMITIES:  No edema; No deformity   ASSESSMENT AND PLAN:   SOB:   She is not having any further shortness of breath.  No change in therapy.   Palpitations: She has premature atrial contractions but I do not see any documentation of previously mentioned atrial fibrillation.  No further testing is indicated.   Hypertension:   Her blood pressure is controlled despite being off her blood pressure medications and she is being followed very closely by her primary provider.  No change in therapy.          Follow up with me as needed.   Signed, Rollene Rotunda, MD

## 2023-07-06 ENCOUNTER — Encounter: Payer: Self-pay | Admitting: Cardiology

## 2023-07-06 ENCOUNTER — Ambulatory Visit: Payer: Medicare Other | Admitting: Cardiology

## 2023-07-06 VITALS — BP 142/78 | HR 83 | Ht 64.0 in | Wt 178.0 lb

## 2023-07-06 DIAGNOSIS — R0602 Shortness of breath: Secondary | ICD-10-CM | POA: Diagnosis not present

## 2023-07-06 DIAGNOSIS — R002 Palpitations: Secondary | ICD-10-CM

## 2023-07-06 DIAGNOSIS — I10 Essential (primary) hypertension: Secondary | ICD-10-CM

## 2023-07-06 NOTE — Patient Instructions (Signed)
Medication Instructions:  The current medical regimen is effective;  continue present plan and medications.  *If you need a refill on your cardiac medications before your next appointment, please call your pharmacy*  Follow-Up: At [facility], you and your health needs are our priority.  As part of our continuing mission to provide you with exceptional heart care, we have created designated Provider Care Teams.  These Care Teams include your primary Cardiologist (physician) and Advanced Practice Providers (APPs -  Physician Assistants and Nurse Practitioners) who all work together to provide you with the care you need, when you need it.  We recommend signing up for the patient portal called "MyChart".  Sign up information is provided on this After Visit Summary.  MyChart is used to connect with patients for Virtual Visits (Telemedicine).  Patients are able to view lab/test results, encounter notes, upcoming appointments, etc.  Non-urgent messages can be sent to your provider as well.   To learn more about what you can do with MyChart, go to [email or web address].    Your next appointment:   Follow up as needed.  

## 2023-10-18 DIAGNOSIS — Z20822 Contact with and (suspected) exposure to covid-19: Secondary | ICD-10-CM | POA: Diagnosis not present

## 2023-10-18 DIAGNOSIS — J029 Acute pharyngitis, unspecified: Secondary | ICD-10-CM | POA: Diagnosis not present

## 2023-10-18 DIAGNOSIS — J209 Acute bronchitis, unspecified: Secondary | ICD-10-CM | POA: Diagnosis not present

## 2023-10-18 DIAGNOSIS — R5383 Other fatigue: Secondary | ICD-10-CM | POA: Diagnosis not present

## 2023-10-18 DIAGNOSIS — R051 Acute cough: Secondary | ICD-10-CM | POA: Diagnosis not present

## 2023-11-02 DIAGNOSIS — R059 Cough, unspecified: Secondary | ICD-10-CM | POA: Diagnosis not present

## 2023-11-02 DIAGNOSIS — J209 Acute bronchitis, unspecified: Secondary | ICD-10-CM | POA: Diagnosis not present

## 2023-11-02 DIAGNOSIS — I10 Essential (primary) hypertension: Secondary | ICD-10-CM | POA: Diagnosis not present

## 2023-12-07 DIAGNOSIS — Z1329 Encounter for screening for other suspected endocrine disorder: Secondary | ICD-10-CM | POA: Diagnosis not present

## 2023-12-07 DIAGNOSIS — E559 Vitamin D deficiency, unspecified: Secondary | ICD-10-CM | POA: Diagnosis not present

## 2023-12-07 DIAGNOSIS — E876 Hypokalemia: Secondary | ICD-10-CM | POA: Diagnosis not present

## 2023-12-07 DIAGNOSIS — Z13220 Encounter for screening for lipoid disorders: Secondary | ICD-10-CM | POA: Diagnosis not present

## 2023-12-07 DIAGNOSIS — I10 Essential (primary) hypertension: Secondary | ICD-10-CM | POA: Diagnosis not present

## 2023-12-07 DIAGNOSIS — D518 Other vitamin B12 deficiency anemias: Secondary | ICD-10-CM | POA: Diagnosis not present

## 2023-12-12 DIAGNOSIS — Z0001 Encounter for general adult medical examination with abnormal findings: Secondary | ICD-10-CM | POA: Diagnosis not present

## 2023-12-12 DIAGNOSIS — Z1389 Encounter for screening for other disorder: Secondary | ICD-10-CM | POA: Diagnosis not present

## 2023-12-12 DIAGNOSIS — Z0000 Encounter for general adult medical examination without abnormal findings: Secondary | ICD-10-CM | POA: Diagnosis not present

## 2023-12-13 DIAGNOSIS — I10 Essential (primary) hypertension: Secondary | ICD-10-CM | POA: Diagnosis not present

## 2023-12-13 DIAGNOSIS — E876 Hypokalemia: Secondary | ICD-10-CM | POA: Diagnosis not present

## 2023-12-13 DIAGNOSIS — M79604 Pain in right leg: Secondary | ICD-10-CM | POA: Diagnosis not present

## 2023-12-13 DIAGNOSIS — D518 Other vitamin B12 deficiency anemias: Secondary | ICD-10-CM | POA: Diagnosis not present

## 2023-12-13 DIAGNOSIS — Z0001 Encounter for general adult medical examination with abnormal findings: Secondary | ICD-10-CM | POA: Diagnosis not present

## 2024-02-03 DIAGNOSIS — R509 Fever, unspecified: Secondary | ICD-10-CM | POA: Diagnosis not present

## 2024-02-03 DIAGNOSIS — J209 Acute bronchitis, unspecified: Secondary | ICD-10-CM | POA: Diagnosis not present

## 2024-02-03 DIAGNOSIS — R0981 Nasal congestion: Secondary | ICD-10-CM | POA: Diagnosis not present

## 2024-02-03 DIAGNOSIS — R059 Cough, unspecified: Secondary | ICD-10-CM | POA: Diagnosis not present

## 2024-02-08 DIAGNOSIS — H2511 Age-related nuclear cataract, right eye: Secondary | ICD-10-CM | POA: Diagnosis not present

## 2024-02-08 DIAGNOSIS — H40832 Aqueous misdirection, left eye: Secondary | ICD-10-CM | POA: Diagnosis not present

## 2024-02-08 DIAGNOSIS — Z961 Presence of intraocular lens: Secondary | ICD-10-CM | POA: Diagnosis not present

## 2024-02-08 DIAGNOSIS — H268 Other specified cataract: Secondary | ICD-10-CM | POA: Diagnosis not present

## 2024-02-14 ENCOUNTER — Other Ambulatory Visit: Payer: Self-pay

## 2024-02-14 ENCOUNTER — Encounter (HOSPITAL_COMMUNITY): Payer: Self-pay

## 2024-02-14 ENCOUNTER — Emergency Department (HOSPITAL_COMMUNITY)

## 2024-02-14 ENCOUNTER — Emergency Department (HOSPITAL_COMMUNITY)
Admission: EM | Admit: 2024-02-14 | Discharge: 2024-02-14 | Disposition: A | Discharge status: Home / Self Care | Attending: Emergency Medicine | Admitting: Emergency Medicine

## 2024-02-14 DIAGNOSIS — R278 Other lack of coordination: Secondary | ICD-10-CM | POA: Diagnosis not present

## 2024-02-14 DIAGNOSIS — R42 Dizziness and giddiness: Secondary | ICD-10-CM | POA: Diagnosis present

## 2024-02-14 DIAGNOSIS — J019 Acute sinusitis, unspecified: Secondary | ICD-10-CM | POA: Diagnosis not present

## 2024-02-14 DIAGNOSIS — I2510 Atherosclerotic heart disease of native coronary artery without angina pectoris: Secondary | ICD-10-CM | POA: Insufficient documentation

## 2024-02-14 DIAGNOSIS — R29818 Other symptoms and signs involving the nervous system: Secondary | ICD-10-CM | POA: Diagnosis not present

## 2024-02-14 DIAGNOSIS — I10 Essential (primary) hypertension: Secondary | ICD-10-CM | POA: Insufficient documentation

## 2024-02-14 DIAGNOSIS — H532 Diplopia: Secondary | ICD-10-CM | POA: Insufficient documentation

## 2024-02-14 HISTORY — DX: Essential (primary) hypertension: I10

## 2024-02-14 LAB — COMPREHENSIVE METABOLIC PANEL WITH GFR
ALT: 16 U/L (ref 0–44)
AST: 17 U/L (ref 15–41)
Albumin: 4.1 g/dL (ref 3.5–5.0)
Alkaline Phosphatase: 116 U/L (ref 38–126)
Anion gap: 10 (ref 5–15)
BUN: 18 mg/dL (ref 8–23)
CO2: 23 mmol/L (ref 22–32)
Calcium: 10.1 mg/dL (ref 8.9–10.3)
Chloride: 107 mmol/L (ref 98–111)
Creatinine, Ser: 0.83 mg/dL (ref 0.44–1.00)
GFR, Estimated: >60 mL/min (ref >60)
Glucose, Bld: 114 mg/dL — ABNORMAL HIGH (ref 70–99)
Potassium: 3.8 mmol/L (ref 3.5–5.1)
Sodium: 140 mmol/L (ref 135–145)
Total Bilirubin: 0.8 mg/dL (ref 0.0–1.2)
Total Protein: 7 g/dL (ref 6.5–8.1)

## 2024-02-14 LAB — CBC WITH DIFFERENTIAL/PLATELET
Abs Immature Granulocytes: 0.02 10*3/uL (ref 0.00–0.07)
Basophils Absolute: 0 10*3/uL (ref 0.0–0.1)
Basophils Relative: 0 %
Eosinophils Absolute: 0.1 10*3/uL (ref 0.0–0.5)
Eosinophils Relative: 2 %
HCT: 41.7 % (ref 36.0–46.0)
Hemoglobin: 13.4 g/dL (ref 12.0–15.0)
Immature Granulocytes: 0 %
Lymphocytes Relative: 30 %
Lymphs Abs: 2 10*3/uL (ref 0.7–4.0)
MCH: 27.7 pg (ref 26.0–34.0)
MCHC: 32.1 g/dL (ref 30.0–36.0)
MCV: 86.2 fL (ref 80.0–100.0)
Monocytes Absolute: 0.3 10*3/uL (ref 0.1–1.0)
Monocytes Relative: 5 %
Neutro Abs: 4 10*3/uL (ref 1.7–7.7)
Neutrophils Relative %: 63 %
Platelets: 197 10*3/uL (ref 150–400)
RBC: 4.84 MIL/uL (ref 3.87–5.11)
RDW: 13.1 % (ref 11.5–15.5)
WBC: 6.5 10*3/uL (ref 4.0–10.5)
nRBC: 0 % (ref 0.0–0.2)

## 2024-02-14 MED ORDER — AMOXICILLIN-POT CLAVULANATE 875-125 MG PO TABS: 1.0000 | ORAL_TABLET | Freq: Two times a day (BID) | ORAL | 0 refills | Status: AC

## 2024-02-14 MED ORDER — LORAZEPAM 2 MG/ML IJ SOLN
0.5000 mg | Freq: Once | INTRAMUSCULAR | Status: AC | PRN
  Administered 2024-02-14: 0.5 mg via INTRAVENOUS
  Filled 2024-02-14: qty 1

## 2024-02-14 NOTE — Discharge Instructions (Addendum)
 We evaluated you for your dizziness, double vision, and trouble walking.  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  Although your stroke is old, stroke symptoms can come back when you have an infection.  It does seem like you have a sinus infection which may have triggered your symptoms.  Please follow-up closely with your primary care physician.  We discussed following up with a neurologist but you preferred to follow-up with your primary doctor.  If you notice any new symptoms such as high fevers, trouble swallowing or trouble breathing, weakness in your arms or legs, facial droop, loss of vision, trouble swallowing, trouble speaking, or any other new symptoms, please return immediately to the emergency department.

## 2024-02-14 NOTE — ED Triage Notes (Signed)
 Pt arrived via POV c/o diplopia in her left eye since Saturday. Pt reports she called her doctor and was advised to go to the ER for evaluation of possible stroke. Pt reports LKW was Friday. Pt denies any other neuro deficit. Pt reports recently taking amoxicillin and thinks her antibiotic may be the cause.

## 2024-02-14 NOTE — ED Provider Notes (Signed)
    ED Course / MDM   Clinical Course as of 02/14/24 1729  Tue Feb 14, 2024  1509 Received sign out from Dr. Jeannie Milo pending MRI. Presenting with double vision and ataxia. Pending MRI [WS]  1708 MRI without sign of acute stroke, shows sinusitis.  Discussed with patient, has been having runny nose and cough, some facial pressure.  Was prescribed amoxicillin by her primary doctor but stopped taking it.  Does show old cerebellar stroke.  Suspect symptoms may be due to recrudescence of prior stroke in setting of infection.  She reports currently she feels well and her symptoms have essentially resolved.  Has been ambulatory without difficulty.  Did not know she had a prior stroke, offered neurology referral but she would prefers to follow-up with her primary doctor.  Discussed strict return precautions for any strokelike symptoms. Will discharge patient to home. All questions answered. Patient comfortable with plan of discharge. Return precautions discussed with patient and specified on the after visit summary. [WS]    Clinical Course User Index [WS] Mordecai Applebaum, MD   Medical Decision Making Amount and/or Complexity of Data Reviewed Labs: ordered. Radiology: ordered.  Risk Prescription drug management.          Mordecai Applebaum, MD 02/14/24 1729

## 2024-02-14 NOTE — ED Provider Notes (Signed)
 [place] EMERGENCY DEPARTMENT AT [facility] Provider Note  CSN: [id number] Arrival date & time: 02/14/24 1213  Chief Complaint(s) Diplopia  HPI Anna Flynn is a 83 y.o. female who presents emerged part for evaluation of diplopia, ataxia and dizziness.  States that she has intermittent episodes of vertigo where she will take meclizine go to sleep and this will self resolve.  However, over the last 5 days she has had persistent double vision and a sensation of unsteadiness on her feet with falling to the left.  She states that the symptoms began abruptly while grocery shopping.  Recently started amoxicillin and is concerned that this might be the cause.  Denies numbness, tingling, weakness or other neurologic complaints.  Spoke with her primary care physician who recommended that she come to the emergency department for further evaluation.   Past Medical History Past Medical History:  Diagnosis Date   Cataract    07/27/17   Eye trauma    06/2017   Hypertension    Patient Active Problem List   Diagnosis Date Noted   SOB (shortness of breath) 07/03/2023   NSVT (nonsustained ventricular tachycardia) (HCC) 09/15/2021   Nonspecific abnormal electrocardiogram (ECG) (EKG) 05/12/2021   Palpitations 05/12/2021   CAD 03/26/2010   ESSENTIAL HYPERTENSION, BENIGN 03/03/2010   Home Medication(s) Prior to Admission medications   Medication Sig Start Date End Date Taking? Authorizing Provider  amoxicillin-clavulanate (AUGMENTIN) 875-125 MG tablet Take 1 tablet by mouth every 12 (twelve) hours for 10 days. 02/14/24 02/24/24 Yes Mordecai Applebaum, MD  acetaminophen (TYLENOL) 650 MG CR tablet Take 650 mg by mouth every 8 (eight) hours as needed for pain.    [provider]  D3 HIGH POTENCY 50 MCG (2000 UT) CAPS Take 2 capsules by mouth daily. 12/13/23   [provider]  ibuprofen (ADVIL) 200 MG tablet Take 200 mg by mouth as needed.    [provider]   Potassium Chloride ER 20 MEQ TBCR Take 1 tablet by mouth daily. 12/13/23   [provider]                                                                                                                                    Past Surgical History Past Surgical History:  Procedure Laterality Date   CHOLECYSTECTOMY     LEFT THUMB A1 PULLEY RELEASE     Family History Family History  Problem Relation Age of Onset   Cancer Mother    Cancer Father        Prostate   Cancer Sister        3 sisters (uterine, lung, melanoma)   Cancer Brother        COPD    Social History Social History   Tobacco Use   Smoking status: Never   Smokeless tobacco: Never  Vaping Use   Vaping status: Never Used  Substance Use Topics  Alcohol  use: No   Drug use: No   Allergies Codeine and Nabumetone  Review of Systems Review of Systems  Eyes:  Positive for visual disturbance.  Neurological:  Positive for dizziness.    Physical Exam Vital Signs  I have reviewed the triage vital signs BP (!) 167/89 (BP Location: Right Arm)   Pulse 87   Temp 97.8 F (36.6 C) (Oral)   Resp 16   Ht 5' 4 (1.626 m)   Wt 78.5 kg   SpO2 96%   BMI 29.70 kg/m   Physical Exam Vitals and nursing note reviewed.  Constitutional:      General: She is not in acute distress.    Appearance: She is well-developed.  HENT:     Head: Normocephalic and atraumatic.   Eyes:     Conjunctiva/sclera: Conjunctivae normal.    Cardiovascular:     Rate and Rhythm: Normal rate and regular rhythm.     Heart sounds: No murmur heard. Pulmonary:     Effort: Pulmonary effort is normal. No respiratory distress.     Breath sounds: Normal breath sounds.  Abdominal:     Palpations: Abdomen is soft.     Tenderness: There is no abdominal tenderness.   Musculoskeletal:        General: No swelling.     Cervical back: Neck supple.   Skin:    General: Skin is warm and dry.     Capillary Refill: Capillary refill takes less  than 2 seconds.   Neurological:     Mental Status: She is alert.     Coordination: Coordination abnormal.     Gait: Gait abnormal.   Psychiatric:        Mood and Affect: Mood normal.     ED Results and Treatments Labs (all labs ordered are listed, but only abnormal results are displayed) Labs Reviewed  COMPREHENSIVE METABOLIC PANEL WITH GFR - Abnormal; Notable for the following components:      Result Value   Glucose, Bld 114 (*)    All other components within normal limits  CBC WITH DIFFERENTIAL/PLATELET  DIFFERENTIAL  CBG MONITORING, ED                                                                                                                          Radiology MR BRAIN WO CONTRAST Result Date: 02/14/2024 CLINICAL DATA:  Initial evaluation for acute neuro deficit, stroke suspected. EXAM: MRI HEAD WITHOUT CONTRAST MRA HEAD WITHOUT CONTRAST TECHNIQUE: Multiplanar, multi-echo pulse sequences of the brain and surrounding structures were acquired without intravenous contrast. Angiographic images of the Circle of Willis were acquired using MRA technique without intravenous contrast. COMPARISON:  Comparison made with prior CT from earlier the same day. FINDINGS: MRI HEAD FINDINGS Brain: Cerebral volume within normal limits for age. Few scattered subcentimeter foci of T2/FLAIR hyperintensity noted involving the supratentorial cerebral white matter, nonspecific, but overall minimal in nature and less than is typically seen for patient age. Few scattered  remote bilateral cerebellar infarcts noted. No abnormal foci of restricted diffusion to suggest acute or subacute ischemia. Gray-white matter differentiation maintained. No areas of chronic cortical infarction. No acute or chronic intracranial blood products. No mass lesion, midline shift or mass effect. No hydrocephalus or extra-axial fluid collection. Pituitary gland within normal limits. Vascular: Major intracranial vascular flow voids are  maintained. Skull and upper cervical spine: Cranial junction within normal limits. Bone marrow signal intensity mildly heterogeneous but overall within normal limits. No scalp soft tissue abnormality. Sinuses/Orbits: Prior ocular lens replacement on the left. Scattered mucosal thickening with a few air-fluid levels noted within the right sphenoid and left maxillary sinuses. Trace bilateral mastoid effusions noted, of doubtful significance. Image nasopharynx unremarkable. Other: None. MRA HEAD FINDINGS Anterior circulation: Examination mildly degraded by motion artifact. Both internal carotid arteries are widely patent through the siphons without stenosis or other abnormality. A1 segments patent bilaterally. Normal anterior communicating artery complex. Anterior cerebral arteries patent without significant stenosis. No M1 stenosis or occlusion. Distal MCA branches perfused and symmetric. Posterior circulation: Left V4 segment dominant and widely patent. Left PICA patent. Focal severe distal right V4 stenosis (series 1015, image 9). Right PICA not seen. Basilar patent without stenosis. Superior cerebral arteries patent bilaterally. Both PCAs primarily supplied via the basilar patent to their distal aspects without significant stenosis. Anatomic variants: As above.  No intracranial aneurysm. IMPRESSION: MRI HEAD: 1. No acute intracranial abnormality. 2. Few scattered remote bilateral cerebellar infarcts. 3. Mild scattered mucosal thickening with a few air-fluid levels within the right sphenoid and left maxillary sinuses. Clinical correlation for possible acute sinusitis recommended. MRA HEAD: 1. Negative intracranial MRA for large vessel occlusion. 2. Focal severe distal right V4 stenosis. 3. Otherwise wide patency of the major intracranial arterial circulation. No other hemodynamically significant or correctable stenosis. Electronically Signed   By: Virgia Griffins M.D.   On: 02/14/2024 16:09   MR ANGIO HEAD WO  CONTRAST Result Date: 02/14/2024 CLINICAL DATA:  Initial evaluation for acute neuro deficit, stroke suspected. EXAM: MRI HEAD WITHOUT CONTRAST MRA HEAD WITHOUT CONTRAST TECHNIQUE: Multiplanar, multi-echo pulse sequences of the brain and surrounding structures were acquired without intravenous contrast. Angiographic images of the Circle of Willis were acquired using MRA technique without intravenous contrast. COMPARISON:  Comparison made with prior CT from earlier the same day. FINDINGS: MRI HEAD FINDINGS Brain: Cerebral volume within normal limits for age. Few scattered subcentimeter foci of T2/FLAIR hyperintensity noted involving the supratentorial cerebral white matter, nonspecific, but overall minimal in nature and less than is typically seen for patient age. Few scattered remote bilateral cerebellar infarcts noted. No abnormal foci of restricted diffusion to suggest acute or subacute ischemia. Gray-white matter differentiation maintained. No areas of chronic cortical infarction. No acute or chronic intracranial blood products. No mass lesion, midline shift or mass effect. No hydrocephalus or extra-axial fluid collection. Pituitary gland within normal limits. Vascular: Major intracranial vascular flow voids are maintained. Skull and upper cervical spine: Cranial junction within normal limits. Bone marrow signal intensity mildly heterogeneous but overall within normal limits. No scalp soft tissue abnormality. Sinuses/Orbits: Prior ocular lens replacement on the left. Scattered mucosal thickening with a few air-fluid levels noted within the right sphenoid and left maxillary sinuses. Trace bilateral mastoid effusions noted, of doubtful significance. Image nasopharynx unremarkable. Other: None. MRA HEAD FINDINGS Anterior circulation: Examination mildly degraded by motion artifact. Both internal carotid arteries are widely patent through the siphons without stenosis or other abnormality. A1 segments patent  bilaterally. Normal anterior  communicating artery complex. Anterior cerebral arteries patent without significant stenosis. No M1 stenosis or occlusion. Distal MCA branches perfused and symmetric. Posterior circulation: Left V4 segment dominant and widely patent. Left PICA patent. Focal severe distal right V4 stenosis (series 1015, image 9). Right PICA not seen. Basilar patent without stenosis. Superior cerebral arteries patent bilaterally. Both PCAs primarily supplied via the basilar patent to their distal aspects without significant stenosis. Anatomic variants: As above.  No intracranial aneurysm. IMPRESSION: MRI HEAD: 1. No acute intracranial abnormality. 2. Few scattered remote bilateral cerebellar infarcts. 3. Mild scattered mucosal thickening with a few air-fluid levels within the right sphenoid and left maxillary sinuses. Clinical correlation for possible acute sinusitis recommended. MRA HEAD: 1. Negative intracranial MRA for large vessel occlusion. 2. Focal severe distal right V4 stenosis. 3. Otherwise wide patency of the major intracranial arterial circulation. No other hemodynamically significant or correctable stenosis. Electronically Signed   By: Virgia Griffins M.D.   On: 02/14/2024 16:09   CT HEAD WO CONTRAST Result Date: 02/14/2024 CLINICAL DATA:  Neuro deficit, concern for stroke.  Diplopia. EXAM: CT HEAD WITHOUT CONTRAST TECHNIQUE: Contiguous axial images were obtained from the base of the skull through the vertex without intravenous contrast. RADIATION DOSE REDUCTION: This exam was performed according to the departmental dose-optimization program which includes automated exposure control, adjustment of the mA and/or kV according to patient size and/or use of iterative reconstruction technique. COMPARISON:  MRI orbits 08/19/2017. FINDINGS: Brain: No acute intracranial hemorrhage. No CT evidence of acute infarct. No edema, mass effect, or midline shift. The basilar cisterns are patent. Remote  infarct in the right cerebellum. Ventricles: The ventricles are normal. Vascular: Atherosclerotic calcifications of the carotid siphons. No hyperdense vessel. Skull: No acute or aggressive finding. Orbits: The visualized globes are intact. Left lens is not visualized and may be surgically absent. Sinuses: Mucosal thickening in the visualized paranasal sinuses particularly within the right sphenoid sinus and left maxillary sinus. Air-fluid level in the left maxillary sinus. Other: Mastoid air cells are clear. IMPRESSION: No CT evidence of acute intracranial abnormality. Remote infarct in the right cerebellum. Mucosal thickening in the partially visualized paranasal sinuses. Air-fluid level in the left maxillary sinus, recommend clinical correlation for acute sinusitis. Electronically Signed   By: Denny Flack M.D.   On: 02/14/2024 13:00    Pertinent labs & imaging results that were available during my care of the patient were reviewed by me and considered in my medical decision making (see MDM for details).  Medications Ordered in ED Medications  LORazepam (ATIVAN) injection 0.5 mg (0.5 mg Intravenous Given 02/14/24 1343)                                                                                                                                     Procedures Procedures  (including critical care time)  Medical Decision Making / ED Course   This patient presents to the ED  for concern of diplopia, ataxia, this involves an extensive number of treatment options, and is a complaint that carries with it a high risk of complications and morbidity.  The differential diagnosis includes CVA, vertigo, mass, ICH, IIH  MDM: Patient seen emergency room for evaluation of ataxia and diplopia.  Physical exam with some possible asymmetry on extraocular movement exam, difficulty with heel toe walking.  No significant ataxia with normal walking and no dysdiadochokinesia.  Neurologic exam otherwise unremarkable.   Laboratory evaluation is unremarkable.  CT head showing a remote right cerebellar infarct and this appears to be news to the patient as she has never been told she has had a stroke before or been diagnosed with a stroke before.  Pending MRI at time of signout.  Please see provider signout of continuation of workup.   Additional history obtained: -Additional history obtained from husband -External records from outside source obtained and reviewed including: Chart review including previous notes, labs, imaging, consultation notes   Lab Tests: -I ordered, reviewed, and interpreted labs.   The pertinent results include:   Labs Reviewed  COMPREHENSIVE METABOLIC PANEL WITH GFR - Abnormal; Notable for the following components:      Result Value   Glucose, Bld 114 (*)    All other components within normal limits  CBC WITH DIFFERENTIAL/PLATELET  DIFFERENTIAL  CBG MONITORING, ED      EKG   EKG Interpretation Date/Time:  Tuesday February 14 2024 12:31:32 EDT Ventricular Rate:  100 PR Interval:  136 QRS Duration:  92 QT Interval:  346 QTC Calculation: 446 R Axis:   6  Text Interpretation: Sinus rhythm with Premature atrial complexes Septal infarct (cited on or before 14-Feb-2024) Abnormal ECG When compared with ECG of 06-Jul-2023 15:12, Premature ventricular complexes are no longer Present Premature atrial complexes are now Present Questionable change in initial forces of Septal leads Confirmed by Hiawatha Lout ([id number]) on 02/14/2024 3:15:41 PM         Imaging Studies ordered: I ordered imaging studies including CT head I independently visualized and interpreted imaging. I agree with the radiologist interpretation  MRI brain, MRA brain ordered and is pending   Medicines ordered and prescription drug management: Meds ordered this encounter  Medications   LORazepam (ATIVAN) injection 0.5 mg   amoxicillin-clavulanate (AUGMENTIN) 875-125 MG tablet    Sig: Take 1 tablet by mouth  every 12 (twelve) hours for 10 days.    Dispense:  20 tablet    Refill:  0    -I have reviewed the patients home medicines and have made adjustments as needed  Critical interventions none    Cardiac Monitoring: The patient was maintained on a cardiac monitor.  I personally viewed and interpreted the cardiac monitored which showed an underlying rhythm of: NSR  Social Determinants of Health:  Factors impacting patients care include: none   Reevaluation: After the interventions noted above, I reevaluated the patient and found that they have :stayed the same  Co morbidities that complicate the patient evaluation  Past Medical History:  Diagnosis Date   Cataract    07/27/17   Eye trauma    06/2017   Hypertension       Dispostion: I considered admission for this patient, and physician pending completion of imaging studies.  Please see provider's note for continuation of workup..     Final Clinical Impression(s) / ED Diagnoses Final diagnoses:  Acute sinusitis, recurrence not specified, unspecified location     @PCDICTATION @    [place], [place],  MD 02/14/24 1836

## 2024-02-15 DIAGNOSIS — H4911 Fourth [trochlear] nerve palsy, right eye: Secondary | ICD-10-CM | POA: Diagnosis not present

## 2024-02-15 DIAGNOSIS — H268 Other specified cataract: Secondary | ICD-10-CM | POA: Diagnosis not present

## 2024-02-15 DIAGNOSIS — H40832 Aqueous misdirection, left eye: Secondary | ICD-10-CM | POA: Diagnosis not present

## 2024-02-15 DIAGNOSIS — Z961 Presence of intraocular lens: Secondary | ICD-10-CM | POA: Diagnosis not present

## 2024-02-15 DIAGNOSIS — H2511 Age-related nuclear cataract, right eye: Secondary | ICD-10-CM | POA: Diagnosis not present

## 2024-02-21 DIAGNOSIS — H4911 Fourth [trochlear] nerve palsy, right eye: Secondary | ICD-10-CM | POA: Diagnosis not present

## 2024-04-05 DIAGNOSIS — Z8673 Personal history of transient ischemic attack (TIA), and cerebral infarction without residual deficits: Secondary | ICD-10-CM | POA: Diagnosis not present

## 2024-04-05 DIAGNOSIS — H532 Diplopia: Secondary | ICD-10-CM | POA: Diagnosis not present

## 2024-04-05 DIAGNOSIS — I10 Essential (primary) hypertension: Secondary | ICD-10-CM | POA: Diagnosis not present

## 2024-04-05 DIAGNOSIS — G529 Cranial nerve disorder, unspecified: Secondary | ICD-10-CM | POA: Diagnosis not present

## 2024-05-03 NOTE — Progress Notes (Unsigned)
 Cardiology Office Note:   Date:  05/04/2024  ID:  YAELIS SCHARFENBERG, DOB 1940-10-04, MRN [id number] PCP: Lari Elspeth BRAVO, MD  [facility] Providers Cardiologist:  None {  History of Present Illness:   Anna Flynn is a 83 y.o. female  who presents for evaluation of palpitations.  She has had regular follow-up with her primary provider and had an EKG that demonstrated atrial ectopy.  Computer suggested atrial fibrillation but there is clearly sinus rhythm with runs of atrial tachycardia.  The patient had cardiac work-up years ago in 2011 with an equivocal stress echo apparently for chest pain.  I cannot find the cath report but she reports it was normal.  Echocardiogram was unremarkable.     She returns for follow-up.   She was to have a PET .  However, we had trouble scheduling that.  This was a couple of years ago and she really has not had any of the symptoms that were bothering her no shortness of breath or chest pain.  She stays very busy.  She did see a neuro-ophthalmologist not long ago because she was having some dizziness and blurred vision.  She had an MRI that suggested possible remote bilateral cerebellar infarcts.  It was suggested that she might need another monitor to look for arrhythmias and an echocardiogram.  She has had a mildly reduced ejection fraction or low normal at 50 to 55% in the past.  She feels okay.  She does not have palpitations, presyncope or syncope.  She might occasionally have a skipped beat.  She is not having any chest pressure, neck or arm discomfort.  She is not having any new shortness of breath, PND or orthopnea.  She has no chest pressure, neck or arm discomfort.   ROS: As stated in the HPI and negative for all other systems.  Studies Reviewed:    EKG:     02/14/2024 sinus rhythm, low voltage in the limb leads, poor anterior R wave progression, premature ectopic complexes, no acute ST-T wave changes.  Risk Assessment/Calculations:               Physical Exam:   VS:  BP 132/84   Pulse 70   Ht 5' 4.5 (1.638 m)   Wt 172 lb 3.2 oz (78.1 kg)   SpO2 97%   BMI 29.10 kg/m    Wt Readings from Last 3 Encounters:  05/04/24 172 lb 3.2 oz (78.1 kg)  02/14/24 173 lb (78.5 kg)  07/06/23 178 lb (80.7 kg)     GEN: Well nourished, well developed in no acute distress NECK: No JVD; No carotid bruits CARDIAC: RRR, no murmurs, rubs, gallops RESPIRATORY:  Clear to auscultation without rales, wheezing or rhonchi  ABDOMEN: Soft, non-tender, non-distended EXTREMITIES:  No edema; No deformity   ASSESSMENT AND PLAN:   SOB: The patient is not really having any of this.  No further workup.    Palpitations:   She rarely feels palpitations.  She does not want to get a wearable.  She does not want an implanted loop.  She will let me know if she has increased palpitations and she might consent to further monitoring.  At this point although there is been a question of fibrillation in the past there is been no documentation and she would not want anticoagulation at this point.   Hypertension:   The blood pressure is well-controlled.  Continue meds as listed.  Possible previous embolic CVA: She had the abnormal MRI  as above.  There is been no acute events consistent with this and she does not recall any TIA-like symptoms in the past.  It was not thought that her recent ophthalmologic complaints were related.  However, I do think is reasonable to screen with another echocardiogram as requested by neuro-ophthalmology and I will arrange this.    Follow up with me as needed  Signed, Lynwood Schilling, MD

## 2024-05-04 ENCOUNTER — Ambulatory Visit: Attending: Cardiology | Admitting: Cardiology

## 2024-05-04 ENCOUNTER — Encounter: Payer: Self-pay | Admitting: Cardiology

## 2024-05-04 VITALS — BP 132/84 | HR 70 | Ht 64.5 in | Wt 172.2 lb

## 2024-05-04 DIAGNOSIS — I639 Cerebral infarction, unspecified: Secondary | ICD-10-CM | POA: Diagnosis not present

## 2024-05-04 NOTE — Patient Instructions (Signed)
 Medication Instructions:  Your physician recommends that you continue on your current medications as directed. Please refer to the Current Medication list given to you today.  *If you need a refill on your cardiac medications before your next appointment, please call your pharmacy*  Lab Work: NONE If you have labs (blood work) drawn today and your tests are completely normal, you will receive your results only by: MyChart Message (if you have MyChart) OR A paper copy in the mail If you have any lab test that is abnormal or we need to change your treatment, we will call you to review the results.  Testing/Procedures: Echocardiogram Your physician has requested that you have an echocardiogram. Echocardiography is a painless test that uses sound waves to create images of your heart. It provides your doctor with information about the size and shape of your heart and how well your heart's chambers and valves are working. This procedure takes approximately one hour. There are no restrictions for this procedure. Please do NOT wear cologne, perfume, aftershave, or lotions (deodorant is allowed). Please arrive 15 minutes prior to your appointment time.  Please note: We ask at that you not bring children with you during ultrasound (echo/ vascular) testing. Due to room size and safety concerns, children are not allowed in the ultrasound rooms during exams. Our front office staff cannot provide observation of children in our lobby area while testing is being conducted. An adult accompanying a patient to their appointment will only be allowed in the ultrasound room at the discretion of the ultrasound technician under special circumstances. We apologize for any inconvenience.   Follow-Up: At [facility], you and your health needs are our priority.  As part of our continuing mission to provide you with exceptional heart care, our providers are all part of one team.  This team includes your primary  Cardiologist (physician) and Advanced Practice Providers or APPs (Physician Assistants and Nurse Practitioners) who all work together to provide you with the care you need, when you need it.  Your next appointment:   As needed  Provider:   [place], MD  We recommend signing up for the patient portal called MyChart.  Sign up information is provided on this After Visit Summary.  MyChart is used to connect with patients for Virtual Visits (Telemedicine).  Patients are able to view lab/test results, encounter notes, upcoming appointments, etc.  Non-urgent messages can be sent to your provider as well.   To learn more about what you can do with MyChart, go to [email or web address].

## 2024-05-30 ENCOUNTER — Ambulatory Visit (HOSPITAL_COMMUNITY)
Admission: RE | Admit: 2024-05-30 | Discharge: 2024-05-30 | Disposition: A | Discharge status: Home / Self Care | Source: Ambulatory Visit | Attending: Cardiology | Admitting: Cardiology

## 2024-05-30 DIAGNOSIS — I2510 Atherosclerotic heart disease of native coronary artery without angina pectoris: Secondary | ICD-10-CM | POA: Insufficient documentation

## 2024-05-30 DIAGNOSIS — R002 Palpitations: Secondary | ICD-10-CM | POA: Insufficient documentation

## 2024-05-30 DIAGNOSIS — I10 Essential (primary) hypertension: Secondary | ICD-10-CM | POA: Insufficient documentation

## 2024-05-30 DIAGNOSIS — Z8616 Personal history of COVID-19: Secondary | ICD-10-CM | POA: Insufficient documentation

## 2024-05-30 DIAGNOSIS — I639 Cerebral infarction, unspecified: Secondary | ICD-10-CM | POA: Insufficient documentation

## 2024-05-30 DIAGNOSIS — I350 Nonrheumatic aortic (valve) stenosis: Secondary | ICD-10-CM | POA: Insufficient documentation

## 2024-05-30 LAB — ECHOCARDIOGRAM COMPLETE
Area-P 1/2: 5.54 cm2
MV M vel: 4.89 m/s
MV Peak grad: 95.6 mmHg
Radius: 0.5 cm
S' Lateral: 4.1 cm

## 2024-05-30 NOTE — Progress Notes (Signed)
*  PRELIMINARY RESULTS* Echocardiogram 2D Echocardiogram has been performed.  Anna Flynn 05/30/2024, 10:33 AM

## 2024-06-04 ENCOUNTER — Ambulatory Visit: Payer: Self-pay | Admitting: Cardiology

## 2024-06-06 DIAGNOSIS — I10 Essential (primary) hypertension: Secondary | ICD-10-CM | POA: Diagnosis not present

## 2024-06-06 DIAGNOSIS — Z13220 Encounter for screening for lipoid disorders: Secondary | ICD-10-CM | POA: Diagnosis not present

## 2024-06-06 DIAGNOSIS — E876 Hypokalemia: Secondary | ICD-10-CM | POA: Diagnosis not present

## 2024-06-06 DIAGNOSIS — E559 Vitamin D deficiency, unspecified: Secondary | ICD-10-CM | POA: Diagnosis not present

## 2024-06-06 DIAGNOSIS — D559 Anemia due to enzyme disorder, unspecified: Secondary | ICD-10-CM | POA: Diagnosis not present

## 2024-06-07 NOTE — Telephone Encounter (Signed)
 Patient called to follow-up on echocardiogram results.  Patient noted she would not be available after 2:00 pm today (10/9).

## 2024-06-13 DIAGNOSIS — R5383 Other fatigue: Secondary | ICD-10-CM | POA: Diagnosis not present

## 2024-06-13 DIAGNOSIS — I10 Essential (primary) hypertension: Secondary | ICD-10-CM | POA: Diagnosis not present

## 2024-06-13 DIAGNOSIS — M170 Bilateral primary osteoarthritis of knee: Secondary | ICD-10-CM | POA: Diagnosis not present

## 2024-06-13 DIAGNOSIS — Z8673 Personal history of transient ischemic attack (TIA), and cerebral infarction without residual deficits: Secondary | ICD-10-CM | POA: Diagnosis not present

## 2024-10-04 ENCOUNTER — Other Ambulatory Visit (HOSPITAL_COMMUNITY): Payer: Self-pay | Admitting: Family Medicine

## 2024-10-04 DIAGNOSIS — S7002XA Contusion of left hip, initial encounter: Secondary | ICD-10-CM

## 2024-10-21 ENCOUNTER — Ambulatory Visit (HOSPITAL_COMMUNITY)
